# Patient Record
Sex: Male | Born: 1940 | Race: White | Hispanic: No | Smoking: Never smoker
Health system: Southern US, Community
[De-identification: ages and names within clinical notes are randomized; demographics above are authoritative.]

## PROBLEM LIST (undated history)

## (undated) DIAGNOSIS — G3185 Corticobasal degeneration: Secondary | ICD-10-CM

## (undated) HISTORY — PX: HERNIA REPAIR: SHX51

## (undated) HISTORY — DX: Corticobasal degeneration: G31.85

## (undated) HISTORY — PX: ANKLE FUSION: SHX881

---

## 2000-11-23 ENCOUNTER — Other Ambulatory Visit: Admission: RE | Admit: 2000-11-23 | Discharge: 2000-11-23 | Payer: Self-pay | Admitting: Internal Medicine

## 2011-12-27 ENCOUNTER — Ambulatory Visit: Payer: Self-pay | Admitting: General Surgery

## 2011-12-27 LAB — BASIC METABOLIC PANEL
Anion Gap: 9 (ref 7–16)
Chloride: 106 mmol/L (ref 98–107)
Co2: 30 mmol/L (ref 21–32)
Creatinine: 1.14 mg/dL (ref 0.60–1.30)
EGFR (Non-African Amer.): >60
Potassium: 4.3 mmol/L (ref 3.5–5.1)
Sodium: 145 mmol/L (ref 136–145)

## 2011-12-27 LAB — CBC
HCT: 45.3 % (ref 40.0–52.0)
HGB: 14.8 g/dL (ref 13.0–18.0)
MCH: 30.1 pg (ref 26.0–34.0)
RBC: 4.91 10*6/uL (ref 4.40–5.90)
RDW: 14.4 % (ref 11.5–14.5)

## 2011-12-29 ENCOUNTER — Ambulatory Visit: Payer: Self-pay | Admitting: General Surgery

## 2015-04-05 NOTE — Op Note (Signed)
PATIENT NAME:  Kevin Valentine, [name] MR#:  [id number] DATE OF BIRTH:  [birth year]  DATE OF PROCEDURE:  12/29/2011  PREOPERATIVE DIAGNOSIS: Umbilical hernia.   POSTOPERATIVE DIAGNOSIS: Umbilical hernia.   OPERATION: Repair of umbilical hernia.   SURGEON: Kathreen CosierS. G. Itati Brocksmith, M.D.   ANESTHESIA: General.   COMPLICATIONS: None.  ESTIMATED BLOOD LOSS: Minimal.  DRAINS: None.   DESCRIPTION OF PROCEDURE: The patient was put to sleep with LMA. The abdomen was prepped and draped out as a sterile field overlying the umbilicus. The patient had a 2 to 3 cm sized hernial protrusion with a fascial defect that appeared to be a little less than a fingerbreadth in size. Along the inferior crease of the umbilicus a skin incision was made and the umbilical skin was dissected off the hernial protrusion superiorly and the remaining dissection was performed to expose the fascial opening. The patient did have a peritoneal sac containing a small portion of the omentum and the sac was excised out flush with the fascial opening using cautery for control of bleeding. The omental tissue was easily pushed back in the abdominal cavity. Given the small size of the hernia, mesh was not utilized. The repair was achieved with two figure-of-eight stitches of 0 Prolene and the defect closed completely. After ensuring hemostasis, the deeper tissue was approximated with interrupted 2-0 Vicryl. The skin was closed with subcuticular 4-0 Vicryl reinforced with Steri-Strips and tincture of benzoin. A dry sterile dressing was placed. The patient tolerated the procedure well. At the conclusion of the closure, about 15 mL of 0.5% Marcaine was instilled in the wound for postoperative analgesia. The patient subsequently returned to the recovery room in stable condition.  ____________________________ S.Wynona LunaG. Leona Alen, MD sgs:ap D: 12/29/2011 09:33:45 ET            T: 12/29/2011 10:45:05 ET              JOB#: [id number] cc: Timoteo ExposeS.G. Evette CristalSankar, MD,  <Dictator> [facility] Wynona LunaG Neena Beecham MD ELECTRONICALLY SIGNED 01/02/2012 10:32

## 2016-07-02 ENCOUNTER — Emergency Department
Admission: EM | Admit: 2016-07-02 | Discharge: 2016-07-02 | Disposition: A | Discharge status: Home / Self Care | Payer: Medicare Other | Attending: Emergency Medicine | Admitting: Emergency Medicine

## 2016-07-02 DIAGNOSIS — W269XXA Contact with unspecified sharp object(s), initial encounter: Secondary | ICD-10-CM | POA: Insufficient documentation

## 2016-07-02 DIAGNOSIS — Z23 Encounter for immunization: Secondary | ICD-10-CM | POA: Insufficient documentation

## 2016-07-02 DIAGNOSIS — Y999 Unspecified external cause status: Secondary | ICD-10-CM | POA: Insufficient documentation

## 2016-07-02 DIAGNOSIS — Y929 Unspecified place or not applicable: Secondary | ICD-10-CM | POA: Insufficient documentation

## 2016-07-02 DIAGNOSIS — Y939 Activity, unspecified: Secondary | ICD-10-CM | POA: Diagnosis not present

## 2016-07-02 DIAGNOSIS — S61512A Laceration without foreign body of left wrist, initial encounter: Secondary | ICD-10-CM | POA: Insufficient documentation

## 2016-07-02 MED ORDER — TETANUS-DIPHTH-ACELL PERTUSSIS 5-2.5-18.5 LF-MCG/0.5 IM SUSP
0.5000 mL | Freq: Once | INTRAMUSCULAR | Status: AC
  Administered 2016-07-02: 0.5 mL via INTRAMUSCULAR
  Filled 2016-07-02: qty 0.5

## 2016-07-02 NOTE — ED Notes (Signed)
Pt was D/C by PA, unable to get vitals due to patient departure. PA reviewed D/C instructions with patient. Per PA pt stated understanding at time of D/C.

## 2016-07-02 NOTE — Discharge Instructions (Signed)
Sutured Wound Care Sutures are stitches that can be used to close wounds. Taking care of your wound properly can help to prevent pain and infection. It can also help your wound to heal more quickly. HOW TO CARE FOR YOUR SUTURED WOUND Wound Care  Keep the wound clean and dry.  If you were given a bandage (dressing), you should change it at least once per day or as directed by your health care provider. You should also change it if it becomes wet or dirty.  Keep the wound completely dry for the first 24 hours or as directed by your health care provider. After that time, you may shower or bathe. However, make sure that the wound is not soaked in water until the sutures have been removed.  Clean the wound one time each day or as directed by your health care provider.  Wash the wound with soap and water.  Rinse the wound with water to remove all soap.  Pat the wound dry with a clean towel. Do not rub the wound.  Aftercleaning the wound, apply a thin layer of antibioticointment as directed by your health care provider. This will help to prevent infection and keep the dressing from sticking to the wound.  Have the sutures removed as directed by your health care provider. General Instructions  Take or apply medicines only as directed by your health care provider.  To help prevent scarring, make sure to cover your wound with sunscreen whenever you are outside after the sutures are removed and the wound is healed. Make sure to wear a sunscreen of at least 30 SPF.  If you were prescribed an antibiotic medicine or ointment, finish all of it even if you start to feel better.  Do not scratch or pick at the wound.  Keep all follow-up visits as directed by your health care provider. This is important.  Check your wound every day for signs of infection. Watch for:   Redness, swelling, or pain.  Fluid, blood, or pus.  Raise (elevate) the injured area above the level of your heart while you  are sitting or lying down, if possible.  Avoid stretching your wound.  Drink enough fluids to keep your urine clear or pale yellow. SEEK MEDICAL CARE IF:  You received a tetanus shot and you have swelling, severe pain, redness, or bleeding at the injection site.  You have a fever.  A wound that was closed breaks open.  You notice a bad smell coming from the wound.  You notice something coming out of the wound, such as wood or glass.  Your pain is not controlled with medicine.  You have increased redness, swelling, or pain at the site of your wound.  You have fluid, blood, or pus coming from your wound.  You notice a change in the color of your skin near your wound.  You need to change the dressing frequently due to fluid, blood, or pus draining from the wound.  You develop a new rash.  You develop numbness around the wound. SEEK IMMEDIATE MEDICAL CARE IF:  You develop severe swelling around the injury site.  Your pain suddenly increases and is severe.  You develop painful lumps near the wound or on skin that is anywhere on your body.  You have a red streak going away from your wound.  The wound is on your hand or foot and you cannot properly move a finger or toe.  The wound is on your hand or foot and  you notice that your fingers or toes look pale or bluish.   This information is not intended to replace advice given to you by your health care provider. Make sure you discuss any questions you have with your health care provider.   Document Released: 01/05/2005 Document Revised: 04/14/2015 Document Reviewed: 07/10/2013 Elsevier Interactive Patient Education 2016 [employer].  Tdap Vaccine (Tetanus, Diphtheria and Pertussis): What You Need to Know 1. Why get vaccinated? Tetanus, diphtheria and pertussis are very serious diseases. Tdap vaccine can protect [place] from these diseases. And, Tdap vaccine given to pregnant women can protect newborn babies against  pertussis. TETANUS (Lockjaw) is rare in the [place] today. It causes painful muscle tightening and stiffness, usually all over the body.  It can lead to tightening of muscles in the head and neck so you can't open your mouth, swallow, or sometimes even breathe. Tetanus kills about 1 out of 10 people who are infected even after receiving the best medical care. DIPHTHERIA is also rare in the [place] today. It can cause a thick coating to form in the back of the throat.  It can lead to breathing problems, heart failure, paralysis, and death. PERTUSSIS (Whooping Cough) causes severe coughing spells, which can cause difficulty breathing, vomiting and disturbed sleep.  It can also lead to weight loss, incontinence, and rib fractures. Up to 2 in 100 adolescents and 5 in 100 adults with pertussis are hospitalized or have complications, which could include pneumonia or death. These diseases are caused by bacteria. Diphtheria and pertussis are spread from person to person through secretions from coughing or sneezing. Tetanus enters the body through cuts, scratches, or wounds. Before vaccines, as many as 200,000 cases of diphtheria, 200,000 cases of pertussis, and hundreds of cases of tetanus, were reported in the [place] each year. Since vaccination began, reports of cases for tetanus and diphtheria have dropped by about 99% and for pertussis by about 80%. 2. Tdap vaccine Tdap vaccine can protect adolescents and adults from tetanus, diphtheria, and pertussis. One dose of Tdap is routinely given at age 24 or 76. People who did not get Tdap at that age should get it as soon as possible. Tdap is especially important for healthcare professionals and anyone having close contact with a baby younger than 12 months. Pregnant women should get a dose of Tdap during every pregnancy, to protect the newborn from pertussis. Infants are most at risk for severe, life-threatening complications from  pertussis. Another vaccine, called Td, protects against tetanus and diphtheria, but not pertussis. A Td booster should be given every 10 years. Tdap may be given as one of these boosters if you have never gotten Tdap before. Tdap may also be given after a severe cut or burn to prevent tetanus infection. Your doctor or the person giving you the vaccine can give you more information. Tdap may safely be given at the same time as other vaccines. 3. Some people should not get this vaccine  A person who has ever had a life-threatening allergic reaction after a previous dose of any diphtheria, tetanus or pertussis containing vaccine, OR has a severe allergy to any part of this vaccine, should not get Tdap vaccine. Tell the person giving the vaccine about any severe allergies.  Anyone who had coma or long repeated seizures within 7 days after a childhood dose of DTP or DTaP, or a previous dose of Tdap, should not get Tdap, unless a cause other than the vaccine was found. They  can still get Td.  Talk to your doctor if you:  have seizures or another nervous system problem,  had severe pain or swelling after any vaccine containing diphtheria, tetanus or pertussis,  ever had a condition called Guillain-Barr Syndrome (GBS),  aren't feeling well on the day the shot is scheduled. 4. Risks With any medicine, including vaccines, there is a chance of side effects. These are usually mild and go away on their own. Serious reactions are also possible but are rare. Most people who get Tdap vaccine do not have any problems with it. Mild problems following Tdap (Did not interfere with activities)  Pain where the shot was given (about 3 in 4 adolescents or 2 in 3 adults)  Redness or swelling where the shot was given (about 1 person in 5)  Mild fever of at least 100.86F (up to about 1 in 25 adolescents or 1 in 100 adults)  Headache (about 3 or 4 people in 10)  Tiredness (about 1 person in 3 or 4)  Nausea,  vomiting, diarrhea, stomach ache (up to 1 in 4 adolescents or 1 in 10 adults)  Chills, sore joints (about 1 person in 10)  Body aches (about 1 person in 3 or 4)  Rash, swollen glands (uncommon) Moderate problems following Tdap (Interfered with activities, but did not require medical attention)  Pain where the shot was given (up to 1 in 5 or 6)  Redness or swelling where the shot was given (up to about 1 in 16 adolescents or 1 in 12 adults)  Fever over 102F (about 1 in 100 adolescents or 1 in 250 adults)  Headache (about 1 in 7 adolescents or 1 in 10 adults)  Nausea, vomiting, diarrhea, stomach ache (up to 1 or 3 people in 100)  Swelling of the entire arm where the shot was given (up to about 1 in 500). Severe problems following Tdap (Unable to perform usual activities; required medical attention)  Swelling, severe pain, bleeding and redness in the arm where the shot was given (rare). Problems that could happen after any vaccine:  People sometimes faint after a medical procedure, including vaccination. Sitting or lying down for about 15 minutes can help prevent fainting, and injuries caused by a fall. Tell your doctor if you feel dizzy, or have vision changes or ringing in the ears.  Some people get severe pain in the shoulder and have difficulty moving the arm where a shot was given. This happens very rarely.  Any medication can cause a severe allergic reaction. Such reactions from a vaccine are very rare, estimated at fewer than 1 in a million doses, and would happen within a few minutes to a few hours after the vaccination. As with any medicine, there is a very remote chance of a vaccine causing a serious injury or death. The safety of vaccines is always being monitored. For more information, visit: http://floyd.org/ 5. What if there is a serious problem? What should I look for?  Look for anything that concerns you, such as signs of a severe allergic reaction, very  high fever, or unusual behavior.  Signs of a severe allergic reaction can include hives, swelling of the face and throat, difficulty breathing, a fast heartbeat, dizziness, and weakness. These would usually start a few minutes to a few hours after the vaccination. What should I do?  If you think it is a severe allergic reaction or other emergency that can't wait, call 9-1-1 or get the person to the nearest hospital.  Otherwise, call your doctor.  Afterward, the reaction should be reported to the Vaccine Adverse Event Reporting System (VAERS). Your doctor might file this report, or you can do it yourself through the VAERS web site at www.vaers.LAgents.nohhs.gov, or by calling 1-865-027-3595. VAERS does not give medical advice.  6. The National Vaccine Injury Compensation Program The [employer] Vaccine Injury Compensation Program (VICP) is a federal program that was created to compensate people who may have been injured by certain vaccines. Persons who believe they may have been injured by a vaccine can learn about the program and about filing a claim by calling 1-219-443-6230 or visiting the VICP website at SpiritualWord.atwww.hrsa.gov/vaccinecompensation. There is a time limit to file a claim for compensation. 7. How can I learn more?  Ask your doctor. He or she can give you the vaccine package insert or suggest other sources of information.  Call your local or state health department.  Contact the Centers for Disease Control and Prevention (CDC):  Call 727-856-38001-919-317-1055 (1-800-CDC-INFO) or  Visit CDC's website at PicCapture.uywww.cdc.gov/vaccines CDC Tdap Vaccine VIS (02/04/14)   This information is not intended to replace advice given to you by your health care provider. Make sure you discuss any questions you have with your health care provider.   Document Released: 05/29/2012 Document Revised: 12/19/2014 Document Reviewed: 03/12/2014 Elsevier Interactive Patient Education [employer]2016 Elsevier Inc.

## 2016-07-02 NOTE — ED Provider Notes (Signed)
[facility] Emergency Department Provider Note  ____________________________________________  Time seen: Approximately 4:57 PM  I have reviewed the triage vital signs and the nursing notes.   HISTORY  Chief Complaint Laceration    HPI Kevin Valentine is a 75 y.o. male , NAD, presents to the emergency department with a several hour history of laceration to the left wrist. States he was cutting a piece of PVC pipe that turned his direction and cut his left forearm/wrist. States he applied pressure and presented to the emergency department. He is uncertain of his last tetanus vaccination and states his primary care provider note that he needed one. Denies any numbness, weakness, tingling. Has had full range of motion of the left upper extremity since the incident. Is not on any blood thinners.   No past medical history on file.  There are no active problems to display for this patient.   No past surgical history on file.  No current outpatient prescriptions on file.  Allergies Review of patient's allergies indicates no known allergies.  No family history on file.  Social History Social History  Substance Use Topics  . Smoking status: Not on file  . Smokeless tobacco: Not on file  . Alcohol Use: Not on file     Review of Systems  Constitutional: No fever/chills Cardiovascular: No chest pain. Respiratory:  No shortness of breath.  Gastrointestinal: No abdominal pain.  No nausea, vomiting.  No diarrhea.  No constipation. Genitourinary: Negative for dysuria. No hematuria. No urinary hesitancy, urgency or increased frequency. Musculoskeletal: Negative for Left arm, wrist, hand pain.  Skin: Positive laceration distal left forearm with bleeding controlled. No ecchymosis, rash, redness, swelling. Neurological: Negative for headaches, focal weakness or numbness. 10-point ROS otherwise  negative.  ____________________________________________   PHYSICAL EXAM:  VITAL SIGNS: ED Triage Vitals  Enc Vitals Group     BP 07/02/16 1621 132/78 mmHg     Pulse Rate 07/02/16 1621 79     Resp 07/02/16 1621 18     Temp 07/02/16 1621 98.4 F (36.9 C)     Temp Source 07/02/16 1621 Oral     SpO2 07/02/16 1621 97 %     Weight 07/02/16 1621 200 lb (90.719 kg)     Height 07/02/16 1621 6' (1.829 m)     Head Cir --      Peak Flow --      Pain Score 07/02/16 1621 3     Pain Loc --      Pain Edu? --      Excl. in GC? --      Constitutional: Alert and oriented. Well appearing and in no acute distress. Eyes: Conjunctivae are normal.  Head: Atraumatic. Cardiovascular: Good peripheral circulation with 2+ pulses noted in the left upper extremity. Capillary refill is brisk in all digits of the left hand. Respiratory: Normal respiratory effort without tachypnea or retractions.  Musculoskeletal: Full range of motion of the left elbow, wrist, hand, fingers and forearm without pain. No joint effusions. Neurologic:  Normal speech and language. No gross focal neurologic deficits are appreciated. Sensation to light touch grossly intact about the left upper extremity. Skin:  5 cm laceration noted to the dorsal left distal forearm with bleeding controlled. No visualization or palpation of foreign body is present. Skin is warm, dry. No rash noted. Psychiatric: Mood and affect are normal. Speech and behavior are normal. Patient exhibits appropriate insight and judgement.   ____________________________________________   LABS  None ____________________________________________  EKG  None ____________________________________________  RADIOLOGY  None ____________________________________________    PROCEDURES  Procedure(s) performed: LACERATION REPAIR Performed by: Hope Pigeon Authorized by: Hope Pigeon Consent: Verbal consent obtained. Risks and benefits: risks, benefits and  alternatives were discussed Consent given by: patient Patient identity confirmed: provided demographic data Prepped and Draped in normal sterile fashion Wound explored  Laceration Location: Left distal forearm  Laceration Length: 5cm  No Foreign Bodies seen or palpated  Anesthesia: local infiltration  Local anesthetic: lidocaine 1% with epinephrine  Anesthetic total: 5 ml  Irrigation method: syringe Amount of cleaning: standard  Skin closure: 4-0 ethilon  Number of sutures: 8  Technique: Simple, interrupted  Patient tolerance: Patient tolerated the procedure well with no immediate complications.      Medications  Tdap (BOOSTRIX) injection 0.5 mL (0.5 mLs Intramuscular Given 07/02/16 1709)     ____________________________________________   INITIAL IMPRESSION / ASSESSMENT AND PLAN / ED COURSE  Patient's diagnosis is consistent with Laceration of left wrist without complication and need for tetanus booster. Patient will be discharged home with instructions for home care to include keeping the wound clean and dry for 48 hours. Patient is to follow up with his primary care provider in 7 days for suture removal sooner if any increasing pain, redness, swelling, oozing, weeping, numbness, weakness, tingling. Patient is given ED precautions to return to the ED for any worsening or new symptoms.    ____________________________________________  FINAL CLINICAL IMPRESSION(S) / ED DIAGNOSES  Final diagnoses:  Laceration of left wrist without complication, initial encounter  Need for tetanus booster      NEW MEDICATIONS STARTED DURING THIS VISIT:  New Prescriptions   No medications on file         Hope Pigeon, PA-C 07/02/16 1817  Governor Rooks, MD 07/02/16 1930

## 2016-07-02 NOTE — ED Notes (Signed)
Pt was cutting a piece of pvc pipe that flew off and hit his left lower arm, laceration above the wrist area, controlled bleeding at this time with gauze

## 2017-01-04 ENCOUNTER — Other Ambulatory Visit: Payer: Self-pay | Admitting: Nurse Practitioner

## 2017-01-04 DIAGNOSIS — G250 Essential tremor: Secondary | ICD-10-CM

## 2017-01-13 ENCOUNTER — Ambulatory Visit
Admission: RE | Admit: 2017-01-13 | Discharge: 2017-01-13 | Disposition: A | Discharge status: Home / Self Care | Payer: Medicare Other | Source: Ambulatory Visit | Attending: Nurse Practitioner | Admitting: Nurse Practitioner

## 2017-01-13 DIAGNOSIS — G250 Essential tremor: Secondary | ICD-10-CM

## 2017-01-13 LAB — POCT I-STAT CREATININE: CREATININE: 1.2 mg/dL (ref 0.61–1.24)

## 2017-01-13 MED ORDER — GADOBENATE DIMEGLUMINE 529 MG/ML IV SOLN
20.0000 mL | Freq: Once | INTRAVENOUS | Status: AC | PRN
  Administered 2017-01-13: 20 mL via INTRAVENOUS

## 2017-01-15 DIAGNOSIS — G250 Essential tremor: Secondary | ICD-10-CM | POA: Insufficient documentation

## 2017-06-25 ENCOUNTER — Encounter: Payer: Self-pay | Admitting: Emergency Medicine

## 2017-06-25 DIAGNOSIS — K810 Acute cholecystitis: Principal | ICD-10-CM | POA: Diagnosis present

## 2017-06-25 DIAGNOSIS — R1011 Right upper quadrant pain: Secondary | ICD-10-CM | POA: Diagnosis not present

## 2017-06-25 DIAGNOSIS — N390 Urinary tract infection, site not specified: Secondary | ICD-10-CM | POA: Diagnosis present

## 2017-06-25 DIAGNOSIS — K822 Perforation of gallbladder: Secondary | ICD-10-CM | POA: Diagnosis present

## 2017-06-25 DIAGNOSIS — Z7982 Long term (current) use of aspirin: Secondary | ICD-10-CM

## 2017-06-25 LAB — URINALYSIS, COMPLETE (UACMP) WITH MICROSCOPIC
BACTERIA UA: NONE SEEN
Bilirubin Urine: NEGATIVE
Glucose, UA: NEGATIVE mg/dL
Hgb urine dipstick: NEGATIVE
Ketones, ur: NEGATIVE mg/dL
NITRITE: NEGATIVE
PROTEIN: NEGATIVE mg/dL
SPECIFIC GRAVITY, URINE: 1.018 (ref 1.005–1.030)
pH: 6 (ref 5.0–8.0)

## 2017-06-25 LAB — COMPREHENSIVE METABOLIC PANEL
ALBUMIN: 4.3 g/dL (ref 3.5–5.0)
ALT: 10 U/L — ABNORMAL LOW (ref 17–63)
AST: 23 U/L (ref 15–41)
Alkaline Phosphatase: 50 U/L (ref 38–126)
Anion gap: 10 (ref 5–15)
BILIRUBIN TOTAL: 0.5 mg/dL (ref 0.3–1.2)
BUN: 20 mg/dL (ref 6–20)
CO2: 25 mmol/L (ref 22–32)
Calcium: 9.5 mg/dL (ref 8.9–10.3)
Chloride: 105 mmol/L (ref 101–111)
Creatinine, Ser: 1.27 mg/dL — ABNORMAL HIGH (ref 0.61–1.24)
GFR calc Af Amer: >60 mL/min (ref >60)
GFR calc non Af Amer: 54 mL/min — ABNORMAL LOW (ref >60)
GLUCOSE: 106 mg/dL — AB (ref 65–99)
POTASSIUM: 4.1 mmol/L (ref 3.5–5.1)
SODIUM: 140 mmol/L (ref 135–145)
TOTAL PROTEIN: 7.6 g/dL (ref 6.5–8.1)

## 2017-06-25 LAB — CBC
HEMATOCRIT: 44.8 % (ref 40.0–52.0)
Hemoglobin: 15.2 g/dL (ref 13.0–18.0)
MCH: 30.1 pg (ref 26.0–34.0)
MCHC: 33.8 g/dL (ref 32.0–36.0)
MCV: 88.9 fL (ref 80.0–100.0)
Platelets: 191 10*3/uL (ref 150–440)
RBC: 5.04 MIL/uL (ref 4.40–5.90)
RDW: 13.9 % (ref 11.5–14.5)
WBC: 12.7 10*3/uL — ABNORMAL HIGH (ref 3.8–10.6)

## 2017-06-25 LAB — TROPONIN I

## 2017-06-25 LAB — LIPASE, BLOOD: Lipase: 31 U/L (ref 11–51)

## 2017-06-25 MED ORDER — ONDANSETRON 4 MG PO TBDP
4.0000 mg | ORAL_TABLET | Freq: Once | ORAL | Status: AC | PRN
  Administered 2017-06-25: 4 mg via ORAL
  Filled 2017-06-25: qty 1

## 2017-06-25 NOTE — ED Triage Notes (Signed)
Pt arrives POV with c/o upper abdominal pain which started around 1600 this afternoon. Pt reports being nauseated and vomiting x 1 in that time. Pt is in pain in triage but otherwise in NAD at this time.

## 2017-06-26 ENCOUNTER — Inpatient Hospital Stay
Admission: EM | Admit: 2017-06-26 | Discharge: 2017-06-27 | DRG: 417 | Disposition: A | Discharge status: Home / Self Care | Payer: Medicare Other | Attending: Surgery | Admitting: Surgery

## 2017-06-26 ENCOUNTER — Emergency Department: Payer: Medicare Other

## 2017-06-26 ENCOUNTER — Inpatient Hospital Stay: Payer: Medicare Other | Admitting: Anesthesiology

## 2017-06-26 ENCOUNTER — Encounter
Admission: EM | Disposition: A | Discharge status: Home / Self Care | Payer: Self-pay | Source: Home / Self Care | Attending: Surgery

## 2017-06-26 ENCOUNTER — Encounter: Payer: Self-pay | Admitting: *Deleted

## 2017-06-26 DIAGNOSIS — K822 Perforation of gallbladder: Secondary | ICD-10-CM | POA: Diagnosis present

## 2017-06-26 DIAGNOSIS — K810 Acute cholecystitis: Secondary | ICD-10-CM | POA: Diagnosis present

## 2017-06-26 DIAGNOSIS — N390 Urinary tract infection, site not specified: Secondary | ICD-10-CM

## 2017-06-26 DIAGNOSIS — K819 Cholecystitis, unspecified: Secondary | ICD-10-CM

## 2017-06-26 DIAGNOSIS — Z7982 Long term (current) use of aspirin: Secondary | ICD-10-CM | POA: Diagnosis not present

## 2017-06-26 DIAGNOSIS — R112 Nausea with vomiting, unspecified: Secondary | ICD-10-CM

## 2017-06-26 DIAGNOSIS — R1013 Epigastric pain: Secondary | ICD-10-CM

## 2017-06-26 DIAGNOSIS — R1011 Right upper quadrant pain: Secondary | ICD-10-CM | POA: Diagnosis present

## 2017-06-26 HISTORY — PX: CHOLECYSTECTOMY: SHX55

## 2017-06-26 LAB — SURGICAL PCR SCREEN
MRSA, PCR: NEGATIVE
Staphylococcus aureus: NEGATIVE

## 2017-06-26 SURGERY — LAPAROSCOPIC CHOLECYSTECTOMY
Anesthesia: General

## 2017-06-26 MED ORDER — ONDANSETRON HCL 4 MG/2ML IJ SOLN
4.0000 mg | Freq: Once | INTRAMUSCULAR | Status: AC
  Administered 2017-06-26: 4 mg via INTRAVENOUS
  Filled 2017-06-26: qty 2

## 2017-06-26 MED ORDER — MORPHINE SULFATE (PF) 2 MG/ML IV SOLN
2.0000 mg | Freq: Once | INTRAVENOUS | Status: AC
Start: 2017-06-26 — End: 2017-06-26
  Administered 2017-06-26 (×2): 2 mg via INTRAVENOUS
  Filled 2017-06-26: qty 1

## 2017-06-26 MED ORDER — PIPERACILLIN-TAZOBACTAM 3.375 G IVPB 30 MIN
3.3750 g | Freq: Once | INTRAVENOUS | Status: AC
  Administered 2017-06-26: 3.375 g via INTRAVENOUS

## 2017-06-26 MED ORDER — HYDROMORPHONE HCL 1 MG/ML IJ SOLN
0.5000 mg | INTRAMUSCULAR | Status: DC | PRN
  Administered 2017-06-26: 0.5 mg via INTRAVENOUS
  Filled 2017-06-26: qty 0.5

## 2017-06-26 MED ORDER — PROPOFOL 10 MG/ML IV BOLUS
INTRAVENOUS | Status: DC | PRN
  Administered 2017-06-26: 150 mg via INTRAVENOUS

## 2017-06-26 MED ORDER — DEXAMETHASONE SODIUM PHOSPHATE 10 MG/ML IJ SOLN
INTRAMUSCULAR | Status: AC
  Filled 2017-06-26: qty 1

## 2017-06-26 MED ORDER — ONDANSETRON HCL 4 MG/2ML IJ SOLN
INTRAMUSCULAR | Status: DC | PRN
  Administered 2017-06-26: 4 mg via INTRAVENOUS

## 2017-06-26 MED ORDER — ONDANSETRON HCL 4 MG/2ML IJ SOLN: 4.0000 mg | Freq: Once | INTRAMUSCULAR | Status: DC | PRN

## 2017-06-26 MED ORDER — ROCURONIUM BROMIDE 100 MG/10ML IV SOLN
INTRAVENOUS | Status: DC | PRN
  Administered 2017-06-26: 30 mg via INTRAVENOUS
  Administered 2017-06-26: 20 mg via INTRAVENOUS
  Administered 2017-06-26: 10 mg via INTRAVENOUS

## 2017-06-26 MED ORDER — ONDANSETRON HCL 4 MG PO TABS: 4.0000 mg | ORAL_TABLET | Freq: Four times a day (QID) | ORAL | Status: DC | PRN

## 2017-06-26 MED ORDER — KETOROLAC TROMETHAMINE 30 MG/ML IJ SOLN
INTRAMUSCULAR | Status: AC
  Filled 2017-06-26: qty 1

## 2017-06-26 MED ORDER — FENTANYL CITRATE (PF) 100 MCG/2ML IJ SOLN
INTRAMUSCULAR | Status: DC | PRN
  Administered 2017-06-26 (×2): 50 ug via INTRAVENOUS
  Administered 2017-06-26: 100 ug via INTRAVENOUS

## 2017-06-26 MED ORDER — PROMETHAZINE HCL 25 MG RE SUPP
25.0000 mg | Freq: Once | RECTAL | Status: AC
  Administered 2017-06-26: 25 mg via RECTAL
  Filled 2017-06-26: qty 1

## 2017-06-26 MED ORDER — MORPHINE SULFATE (PF) 2 MG/ML IV SOLN
INTRAVENOUS | Status: AC
  Administered 2017-06-26: 2 mg via INTRAVENOUS
  Filled 2017-06-26: qty 1

## 2017-06-26 MED ORDER — MORPHINE SULFATE (PF) 2 MG/ML IV SOLN
2.0000 mg | Freq: Once | INTRAVENOUS | Status: AC
  Administered 2017-06-26: 2 mg via INTRAVENOUS

## 2017-06-26 MED ORDER — HYDROCODONE-ACETAMINOPHEN 5-325 MG PO TABS: 1.0000 | ORAL_TABLET | ORAL | Status: DC | PRN

## 2017-06-26 MED ORDER — DEXAMETHASONE SODIUM PHOSPHATE 10 MG/ML IJ SOLN
INTRAMUSCULAR | Status: DC | PRN
  Administered 2017-06-26: 10 mg via INTRAVENOUS

## 2017-06-26 MED ORDER — ONDANSETRON HCL 4 MG/2ML IJ SOLN
4.0000 mg | Freq: Four times a day (QID) | INTRAMUSCULAR | Status: DC | PRN
  Filled 2017-06-26: qty 2

## 2017-06-26 MED ORDER — BUPIVACAINE HCL (PF) 0.5 % IJ SOLN
INTRAMUSCULAR | Status: AC
  Filled 2017-06-26: qty 30

## 2017-06-26 MED ORDER — FENTANYL CITRATE (PF) 100 MCG/2ML IJ SOLN
INTRAMUSCULAR | Status: AC
  Filled 2017-06-26: qty 2

## 2017-06-26 MED ORDER — DEXTROSE IN LACTATED RINGERS 5 % IV SOLN
INTRAVENOUS | Status: DC
  Administered 2017-06-26 – 2017-06-27 (×3): via INTRAVENOUS

## 2017-06-26 MED ORDER — SUGAMMADEX SODIUM 500 MG/5ML IV SOLN
INTRAVENOUS | Status: AC
  Filled 2017-06-26: qty 5

## 2017-06-26 MED ORDER — LIDOCAINE HCL (PF) 1 % IJ SOLN
INTRAMUSCULAR | Status: AC
  Filled 2017-06-26: qty 30

## 2017-06-26 MED ORDER — SODIUM CHLORIDE 0.9 % IV BOLUS (SEPSIS)
1000.0000 mL | Freq: Once | INTRAVENOUS | Status: AC
  Administered 2017-06-26: 1000 mL via INTRAVENOUS

## 2017-06-26 MED ORDER — KETOROLAC TROMETHAMINE 30 MG/ML IJ SOLN
INTRAMUSCULAR | Status: DC | PRN
  Administered 2017-06-26: 30 mg via INTRAVENOUS

## 2017-06-26 MED ORDER — LACTATED RINGERS IV SOLN
INTRAVENOUS | Status: DC | PRN
  Administered 2017-06-26: 11:00:00 via INTRAVENOUS

## 2017-06-26 MED ORDER — LIDOCAINE HCL 1 % IJ SOLN
INTRAMUSCULAR | Status: DC | PRN
  Administered 2017-06-26: 26 mL

## 2017-06-26 MED ORDER — HYDROMORPHONE HCL 1 MG/ML IJ SOLN
0.5000 mg | Freq: Once | INTRAMUSCULAR | Status: AC
  Administered 2017-06-26: 0.5 mg via INTRAVENOUS
  Filled 2017-06-26: qty 1

## 2017-06-26 MED ORDER — HEPARIN SODIUM (PORCINE) 5000 UNIT/ML IJ SOLN
5000.0000 [IU] | Freq: Three times a day (TID) | INTRAMUSCULAR | Status: DC
  Administered 2017-06-26 – 2017-06-27 (×3): 5000 [IU] via SUBCUTANEOUS
  Filled 2017-06-26 (×3): qty 1

## 2017-06-26 MED ORDER — SUGAMMADEX SODIUM 500 MG/5ML IV SOLN
INTRAVENOUS | Status: DC | PRN
  Administered 2017-06-26: 200 mg via INTRAVENOUS

## 2017-06-26 MED ORDER — SODIUM CHLORIDE 0.9 % IV SOLN
3.0000 g | Freq: Four times a day (QID) | INTRAVENOUS | Status: DC
  Administered 2017-06-26 – 2017-06-27 (×5): 3 g via INTRAVENOUS
  Filled 2017-06-26 (×6): qty 3

## 2017-06-26 MED ORDER — ROCURONIUM BROMIDE 50 MG/5ML IV SOLN
INTRAVENOUS | Status: AC
  Filled 2017-06-26: qty 1

## 2017-06-26 MED ORDER — ONDANSETRON HCL 4 MG/2ML IJ SOLN
INTRAMUSCULAR | Status: AC
  Filled 2017-06-26: qty 2

## 2017-06-26 MED ORDER — FENTANYL CITRATE (PF) 100 MCG/2ML IJ SOLN: 25.0000 ug | INTRAMUSCULAR | Status: DC | PRN

## 2017-06-26 SURGICAL SUPPLY — 61 items
ADH LQ OCL WTPRF AMP STRL LF (MISCELLANEOUS) ×1
ADHESIVE MASTISOL STRL (MISCELLANEOUS) ×3 IMPLANT
APPLIER CLIP ROT 10 11.4 M/L (STAPLE) ×3
APR CLP MED LRG 11.4X10 (STAPLE) ×1
BAG COUNTER SPONGE EZ (MISCELLANEOUS) ×1 IMPLANT
BAG SPNG 4X4 CLR HAZ (MISCELLANEOUS)
BLADE SURG SZ11 CARB STEEL (BLADE) ×3 IMPLANT
BULB RESERV EVAC DRAIN JP 100C (MISCELLANEOUS) ×2 IMPLANT
CANISTER SUCT 1200ML W/VALVE (MISCELLANEOUS) ×3 IMPLANT
CANISTER SUCT 3000ML (MISCELLANEOUS) ×2 IMPLANT
CATH CHOLANG 76X19 KUMAR (CATHETERS) ×1 IMPLANT
CHLORAPREP W/TINT 26ML (MISCELLANEOUS) ×3 IMPLANT
CLIP APPLIE ROT 10 11.4 M/L (STAPLE) ×1 IMPLANT
CLOSURE WOUND 1/2 X4 (GAUZE/BANDAGES/DRESSINGS)
CONRAY 60ML FOR OR (MISCELLANEOUS) IMPLANT
COUNTER SPONGE BAG EZ (MISCELLANEOUS)
DECANTER SPIKE VIAL GLASS SM (MISCELLANEOUS) ×6 IMPLANT
DRAIN CHANNEL JP 15F RND 16 (MISCELLANEOUS) ×2 IMPLANT
DRAPE SHEET LG 3/4 BI-LAMINATE (DRAPES) ×1 IMPLANT
DRSG TEGADERM 2-3/8X2-3/4 SM (GAUZE/BANDAGES/DRESSINGS) ×12 IMPLANT
DRSG TELFA 4X3 1S NADH ST (GAUZE/BANDAGES/DRESSINGS) ×5 IMPLANT
ELECT REM PT RETURN 9FT ADLT (ELECTROSURGICAL) ×3
ELECTRODE REM PT RTRN 9FT ADLT (ELECTROSURGICAL) ×1 IMPLANT
GAUZE SPONGE 4X4 12PLY STRL (GAUZE/BANDAGES/DRESSINGS) ×2 IMPLANT
GLOVE BIO SURGEON STRL SZ 6.5 (GLOVE) ×1 IMPLANT
GLOVE BIO SURGEON STRL SZ7.5 (GLOVE) ×9 IMPLANT
GLOVE BIO SURGEONS STRL SZ 6.5 (GLOVE) ×1
GLOVE BIOGEL PI IND STRL 7.0 (GLOVE) IMPLANT
GLOVE BIOGEL PI INDICATOR 7.0 (GLOVE) ×6
GLOVE INDICATOR 8.0 STRL GRN (GLOVE) ×3 IMPLANT
GLOVE SKINSENSE NS SZ6.5 (GLOVE) ×2
GLOVE SKINSENSE STRL SZ6.5 (GLOVE) IMPLANT
GOWN STRL REUS W/ TWL LRG LVL3 (GOWN DISPOSABLE) ×3 IMPLANT
GOWN STRL REUS W/TWL LRG LVL3 (GOWN DISPOSABLE) ×12
GRASPER SUT TROCAR 14GX15 (MISCELLANEOUS) IMPLANT
IRRIGATION STRYKERFLOW (MISCELLANEOUS) IMPLANT
IRRIGATOR STRYKERFLOW (MISCELLANEOUS) ×3
IV NS 1000ML (IV SOLUTION) ×3
IV NS 1000ML BAXH (IV SOLUTION) IMPLANT
L-HOOK LAP DISP 36CM (ELECTROSURGICAL) ×3
LABEL OR SOLS (LABEL) ×3 IMPLANT
LHOOK LAP DISP 36CM (ELECTROSURGICAL) ×1 IMPLANT
NDL HYPO 25X1 1.5 SAFETY (NEEDLE) ×1 IMPLANT
NEEDLE HYPO 25X1 1.5 SAFETY (NEEDLE) ×3 IMPLANT
NEEDLE VERESS 14GA 120MM (NEEDLE) ×3 IMPLANT
NS IRRIG 500ML POUR BTL (IV SOLUTION) ×3 IMPLANT
PACK LAP CHOLECYSTECTOMY (MISCELLANEOUS) ×3 IMPLANT
PENCIL ELECTRO HAND CTR (MISCELLANEOUS) ×3 IMPLANT
POUCH ENDO CATCH 10MM SPEC (MISCELLANEOUS) ×3 IMPLANT
SCISSORS METZENBAUM CVD 33 (INSTRUMENTS) ×3 IMPLANT
SLEEVE ENDOPATH XCEL 5M (ENDOMECHANICALS) ×6 IMPLANT
STRIP CLOSURE SKIN 1/2X4 (GAUZE/BANDAGES/DRESSINGS) IMPLANT
SUT ETHILON 3-0 FS-10 30 BLK (SUTURE) ×3
SUT MNCRL 4-0 (SUTURE) ×6
SUT MNCRL 4-0 27XMFL (SUTURE) ×2
SUT VICRYL 0 AB UR-6 (SUTURE) ×2 IMPLANT
SUTURE EHLN 3-0 FS-10 30 BLK (SUTURE) IMPLANT
SUTURE MNCRL 4-0 27XMF (SUTURE) ×1 IMPLANT
TROCAR XCEL 12X100 BLDLESS (ENDOMECHANICALS) ×3 IMPLANT
TROCAR XCEL NON-BLD 5MMX100MML (ENDOMECHANICALS) ×3 IMPLANT
TUBING INSUFFLATOR HI FLOW (MISCELLANEOUS) ×3 IMPLANT

## 2017-06-26 NOTE — Anesthesia Post-op Follow-up Note (Cosign Needed)
Anesthesia QCDR form completed.        

## 2017-06-26 NOTE — ED Provider Notes (Signed)
[facility] Emergency Department Provider Note   ____________________________________________   First MD Initiated Contact with Patient 06/26/17 [phone]     (approximate)  I have reviewed the triage vital signs and the nursing notes.   HISTORY  Chief Complaint Abdominal Pain    HPI Kevin Valentine is a 76 y.o. male who presents to the ED from home with a chief complaint of abdominal pain and vomiting. Patient reports onset of upper abdominal pain approximately 4 PM associated with nausea and vomiting 1 during that episode. He was able to eat subsequently which did not affect his pain. Presents secondary to persistent aching type pain radiating to his back. Denies recent fever, chills, chest pain, shortness of breath, dysuria, diarrhea, constipation. Denies recent travel or trauma. Nothing makes his symptoms better or worse.   Past medical history None  Patient Active Problem List   Diagnosis Date Noted  . Acute cholecystitis 06/26/2017    Past Surgical History:  Procedure Laterality Date  . ANKLE FUSION Right   . HERNIA REPAIR      Prior to Admission medications   Medication Sig Start Date End Date Taking? Authorizing Provider  aspirin EC 81 MG tablet Take 81 mg by mouth daily.   Yes [provider]    Allergies Patient has no known allergies.  No family history on file.  Social History Social History  Substance Use Topics  . Smoking status: Never Smoker  . Smokeless tobacco: Never Used  . Alcohol use No    Review of Systems  Constitutional: No fever/chills. Eyes: No visual changes. ENT: No sore throat. Cardiovascular: Denies chest pain. Respiratory: Denies shortness of breath. Gastrointestinal: Positive for abdominal pain, nausea and vomiting.  No diarrhea.  No constipation. Genitourinary: Negative for dysuria. Musculoskeletal: Negative for back pain. Skin: Negative for rash. Neurological: Negative for headaches,  focal weakness or numbness.   ____________________________________________   PHYSICAL EXAM:  VITAL SIGNS: ED Triage Vitals  Enc Vitals Group     BP 06/25/17 2238 (!) 180/77     Pulse Rate 06/25/17 2238 (!) 56     Resp 06/25/17 2238 (!) 22     Temp 06/25/17 2238 98.6 F (37 C)     Temp Source 06/25/17 2238 Oral     SpO2 06/25/17 2238 100 %     Weight 06/25/17 2240 210 lb (95.3 kg)     Height 06/25/17 2240 6' (1.829 m)     Head Circumference --      Peak Flow --      Pain Score 06/25/17 2237 8     Pain Loc --      Pain Edu? --      Excl. in GC? --     Constitutional: Alert and oriented. Uncomfortable appearing and in no acute distress. Eyes: Conjunctivae are normal. PERRL. EOMI. Head: Atraumatic. Nose: No congestion/rhinnorhea. Mouth/Throat: Mucous membranes are moist.  Oropharynx non-erythematous. Neck: No stridor.   Cardiovascular: Normal rate, regular rhythm. Grossly normal heart sounds.  Good peripheral circulation. Respiratory: Normal respiratory effort.  No retractions. Lungs CTAB. Gastrointestinal: Soft and mildly tender to palpation epigastrium and right upper quadrant without rebound or guarding. No distention. No abdominal bruits. No CVA tenderness. Musculoskeletal: No lower extremity tenderness nor edema.  No joint effusions. Neurologic:  Normal speech and language. No gross focal neurologic deficits are appreciated. No gait instability. Skin:  Skin is warm, dry and intact. No rash noted. Psychiatric: Mood and affect are normal. Speech and behavior  are normal.  ____________________________________________   LABS (all labs ordered are listed, but only abnormal results are displayed)  Labs Reviewed  COMPREHENSIVE METABOLIC PANEL - Abnormal; Notable for the following:       Result Value   Glucose, Bld 106 (*)    Creatinine, Ser 1.27 (*)    ALT 10 (*)    GFR calc non Af Amer 54 (*)    All other components within normal limits  CBC - Abnormal; Notable for  the following:    WBC 12.7 (*)    All other components within normal limits  URINALYSIS, COMPLETE (UACMP) WITH MICROSCOPIC - Abnormal; Notable for the following:    Color, Urine YELLOW (*)    APPearance CLEAR (*)    Leukocytes, UA MODERATE (*)    Squamous Epithelial / LPF 0-5 (*)    All other components within normal limits  LIPASE, BLOOD  TROPONIN I  CBC  CREATININE, SERUM   ____________________________________________  EKG  ED ECG REPORT I, Sundiata Ferrick J, the attending physician, personally viewed and interpreted this ECG.   Date: 06/26/2017  EKG Time: 2237  Rate: 56  Rhythm: sinus bradycardia  Axis: Normal  Intervals:none  ST&T Change: Nonspecific  ____________________________________________  RADIOLOGY  US Abdomen Limited Ruq  Result Date: 06/26/2017 CLINICAL DATA:  Upper abdominal pain, nausea and vomiting EXAM: ULTRASOUND ABDOMEN LIMITED RIGHT UPPER QUADRANT COMPARISON:  None. FINDINGS: Gallbladder: Biliary sludge with mild gallbladder wall thickening up to 5 mm. Tiny nonshadowing gallstones are not entirely excluded. Patient was medicated and therefore assessment for sonographic Murphy's sign was not reliable. Common bile duct: Diameter: Normal at 3.4 mm Liver: No focal lesion identified. Within normal limits in parenchymal echogenicity. IMPRESSION: Gallbladder distention with wall thickening and biliary sludge. Tiny nonshadowing stones are not entirely excluded. Findings are suspicious for acute cholecystitis. Electronically Signed   By: Tollie Eth M.D.   On: 06/26/2017 03:05    ____________________________________________   PROCEDURES  Procedure(s) performed: None  Procedures  Critical Care performed: No  ____________________________________________   INITIAL IMPRESSION / ASSESSMENT AND PLAN / ED COURSE  Pertinent labs & imaging results that were available during my care of the patient were reviewed by me and considered in my medical decision making (see  chart for details).  76 year old male without significant past medical history who presents with upper abdominal pain and vomiting. Laboratory and urine analysis results remarkable for mild leukocytosis, renal insufficiency, moderate leukocytes. Review of patient's chart demonstrates screening aortic aneurysm ultrasound in 2015 which was within normal limits. Clinically symptoms suggest cholelithiasis/cholecystitis. Will initiate IV fluid resuscitation, analgesia, antibiotics and proceed to right upper quadrant limited ultrasound.  Clinical Course as of Jun 26 416  Mon Jun 26, 2017  1610 Updated patient and son of ultrasound results. Discussed with surgeon on call Dr. Excell Seltzer who will evaluate patient in the emergency department.  [JS]    Clinical Course User Index [JS] Irean Hong, MD     ____________________________________________   FINAL CLINICAL IMPRESSION(S) / ED DIAGNOSES  Final diagnoses:  Epigastric pain  Non-intractable vomiting with nausea, unspecified vomiting type  Lower urinary tract infectious disease  Cholecystitis      NEW MEDICATIONS STARTED DURING THIS VISIT:  New Prescriptions   No medications on file     Note:  This document was prepared using Dragon voice recognition software and may include unintentional dictation errors.    Irean Hong, MD 06/26/17 [phone]

## 2017-06-26 NOTE — ED Notes (Signed)
Pt transport to 206 

## 2017-06-26 NOTE — ED Notes (Signed)
Patient transported to Ultrasound 

## 2017-06-26 NOTE — H&P (Signed)
Kevin Valentine is an 76 y.o. male.    Chief Complaint: Back pain and right upper quadrant pain  HPI: This a patient with the first and only episode of right upper quadrant and back pain that started approximately 1600 on 06/25/2017. It is better now than it was a rates today 2. He's had nausea and multiple emesis but no fevers or chills no jaundice or acholic stools. He's never had an episode like this before. He denies any abdominal surgery.  History reviewed. No pertinent past medical history. Patient does not take any medications and denies any medical problems.  Past Surgical History:  Procedure Laterality Date  . ANKLE FUSION Right   . HERNIA REPAIR      No family history on file. Patient's mother had gallbladder disease Social History:  reports that he has never smoked. He has never used smokeless tobacco. He reports that he does not drink alcohol or use drugs. He is retired from [employer]  Allergies: No Known Allergies   (Not in a hospital admission)   Review of Systems  Constitutional: Negative for chills and fever.  HENT: Negative.   Eyes: Negative.   Respiratory: Negative.   Cardiovascular: Negative.   Gastrointestinal: Positive for abdominal pain, nausea and vomiting. Negative for blood in stool, constipation, diarrhea and heartburn.  Genitourinary: Negative.   Musculoskeletal: Negative.   Skin: Negative.   Neurological: Negative.   Endo/Heme/Allergies: Negative.   Psychiatric/Behavioral: Negative.      Physical Exam:  BP (!) 155/87   Pulse (!) 58   Temp 98.6 F (37 C) (Oral)   Resp 20   Ht 6' (1.829 m)   Wt 210 lb (95.3 kg)   SpO2 98%   BMI 28.48 kg/m   Physical Exam  Constitutional: He is oriented to person, place, and time and well-developed, well-nourished, and in no distress. No distress.  HENT:  Head: Normocephalic and atraumatic.  Eyes: Pupils are equal, round, and reactive to light. Right eye exhibits no discharge. Left  eye exhibits no discharge. No scleral icterus.  Neck: Normal range of motion.  Cardiovascular: Normal rate and regular rhythm.   Pulmonary/Chest: Effort normal. No respiratory distress.  Abdominal: Soft. He exhibits no distension. There is no tenderness. There is no rebound and no guarding.  Minimal tenderness in the right upper quadrant with a negative Murphy sign no guarding or rebound.  Musculoskeletal: Normal range of motion. He exhibits no edema.  Lymphadenopathy:    He has no cervical adenopathy.  Neurological: He is alert and oriented to person, place, and time.  Skin: Skin is warm and dry. No rash noted. He is not diaphoretic. No erythema.  Psychiatric: Mood and affect normal.  Vitals reviewed.       Results for orders placed or performed during the hospital encounter of 06/26/17 (from the past 48 hour(s))  Lipase, blood     Status: None   Collection Time: 06/25/17 10:38 PM  Result Value Ref Range   Lipase 31 11 - 51 U/L  Comprehensive metabolic panel     Status: Abnormal   Collection Time: 06/25/17 10:38 PM  Result Value Ref Range   Sodium 140 135 - 145 mmol/L   Potassium 4.1 3.5 - 5.1 mmol/L   Chloride 105 101 - 111 mmol/L   CO2 25 22 - 32 mmol/L   Glucose, Bld 106 (H) 65 - 99 mg/dL   BUN 20 6 - 20 mg/dL   Creatinine, Ser 5.78 (H) 0.61 - 1.24  mg/dL   Calcium 9.5 8.9 - 10.3 mg/dL   Total Protein 7.6 6.5 - 8.1 g/dL   Albumin 4.3 3.5 - 5.0 g/dL   AST 23 15 - 41 U/L   ALT 10 (L) 17 - 63 U/L   Alkaline Phosphatase 50 38 - 126 U/L   Total Bilirubin 0.5 0.3 - 1.2 mg/dL   GFR calc non Af Amer 54 (L) >60 mL/min   GFR calc Af Amer >60 >60 mL/min    Comment: (NOTE) The eGFR has been calculated using the CKD EPI equation. This calculation has not been validated in all clinical situations. eGFR's persistently <60 mL/min signify possible Chronic Kidney Disease.    Anion gap 10 5 - 15  CBC     Status: Abnormal   Collection Time: 06/25/17 10:38 PM  Result Value Ref Range    WBC 12.7 (H) 3.8 - 10.6 K/uL   RBC 5.04 4.40 - 5.90 MIL/uL   Hemoglobin 15.2 13.0 - 18.0 g/dL   HCT 44.8 40.0 - 52.0 %   MCV 88.9 80.0 - 100.0 fL   MCH 30.1 26.0 - 34.0 pg   MCHC 33.8 32.0 - 36.0 g/dL   RDW 13.9 11.5 - 14.5 %   Platelets 191 150 - 440 K/uL  Urinalysis, Complete w Microscopic     Status: Abnormal   Collection Time: 06/25/17 10:38 PM  Result Value Ref Range   Color, Urine YELLOW (A) YELLOW   APPearance CLEAR (A) CLEAR   Specific Gravity, Urine 1.018 1.005 - 1.030   pH 6.0 5.0 - 8.0   Glucose, UA NEGATIVE NEGATIVE mg/dL   Hgb urine dipstick NEGATIVE NEGATIVE   Bilirubin Urine NEGATIVE NEGATIVE   Ketones, ur NEGATIVE NEGATIVE mg/dL   Protein, ur NEGATIVE NEGATIVE mg/dL   Nitrite NEGATIVE NEGATIVE   Leukocytes, UA MODERATE (A) NEGATIVE   RBC / HPF 0-5 0 - 5 RBC/hpf   WBC, UA 0-5 0 - 5 WBC/hpf   Bacteria, UA NONE SEEN NONE SEEN   Squamous Epithelial / LPF 0-5 (A) NONE SEEN   Mucous PRESENT   Troponin I     Status: None   Collection Time: 06/25/17 10:38 PM  Result Value Ref Range   Troponin I <0.03 <0.03 ng/mL   US Abdomen Limited Ruq  Result Date: 06/26/2017 CLINICAL DATA:  Upper abdominal pain, nausea and vomiting EXAM: ULTRASOUND ABDOMEN LIMITED RIGHT UPPER QUADRANT COMPARISON:  None. FINDINGS: Gallbladder: Biliary sludge with mild gallbladder wall thickening up to 5 mm. Tiny nonshadowing gallstones are not entirely excluded. Patient was medicated and therefore assessment for sonographic Murphy's sign was not reliable. Common bile duct: Diameter: Normal at 3.4 mm Liver: No focal lesion identified. Within normal limits in parenchymal echogenicity. IMPRESSION: Gallbladder distention with wall thickening and biliary sludge. Tiny nonshadowing stones are not entirely excluded. Findings are suspicious for acute cholecystitis. Electronically Signed   By: Ashley Royalty M.D.   On: 06/26/2017 03:05     Assessment/Plan  Ultrasound is reviewed showing probable acute  cholecystitis with sludge and small gallstones. LFTs are within normal limits. Creatinine is slightly elevated 1.27. With the patient's diagnosis of acute cholecystitis he has been given the option of coming in the hospital for surgery or being seen in the office as his pain has improved this morning. He states that his pain is still present and he wants to come in the hospital and consider surgical intervention.  I discussed with him the surgery itself in detail I described for him the  potential for mesh placement and his prior repaired umbilical hernia complicating entry into the abdominal cavity. We discussed risks of bowel injury and conversion to an open procedure. I described for him the laparoscopic approach and the risk of bleeding infection bowel injury bile duct injury or bile duct leak retained bile duct stone any of which could require further surgery. I also mentioned that Dr. Adonis Huguenin would be performing the surgery today or tomorrow depending on surgical scheduling. He and his son understood and agreed to proceed questions were answered for them.  Florene Glen, MD, FACS

## 2017-06-26 NOTE — Anesthesia Preprocedure Evaluation (Signed)
Anesthesia Evaluation  Patient identified by MRN, date of birth, ID band Patient awake    Reviewed: Allergy & Precautions, H&P , NPO status , Patient's Chart, lab work & pertinent test results, reviewed documented beta blocker date and time   Airway Mallampati: II  TM Distance: >3 FB Neck ROM: full    Dental  (+) Teeth Intact   Pulmonary neg pulmonary ROS,    Pulmonary exam normal        Cardiovascular negative cardio ROS Normal cardiovascular exam Rhythm:regular Rate:Normal     Neuro/Psych negative neurological ROS  negative psych ROS   GI/Hepatic negative GI ROS, Neg liver ROS,   Endo/Other  negative endocrine ROS  Renal/GU negative Renal ROS  negative genitourinary   Musculoskeletal   Abdominal   Peds  Hematology negative hematology ROS (+)   Anesthesia Other Findings History reviewed. No pertinent past medical history. Past Surgical History: No date: ANKLE FUSION; Right No date: HERNIA REPAIR BMI    Body Mass Index:  30.61 kg/m     Reproductive/Obstetrics negative OB ROS                             Anesthesia Physical Anesthesia Plan  ASA: II  Anesthesia Plan: General ETT   Post-op Pain Management:    Induction:   PONV Risk Score and Plan: 3 and Ondansetron, Dexamethasone, Propofol and Midazolam  Airway Management Planned:   Additional Equipment:   Intra-op Plan:   Post-operative Plan:   Informed Consent: I have reviewed the patients History and Physical, chart, labs and discussed the procedure including the risks, benefits and alternatives for the proposed anesthesia with the patient or authorized representative who has indicated his/her understanding and acceptance.   Dental Advisory Given  Plan Discussed with: CRNA  Anesthesia Plan Comments:         Anesthesia Quick Evaluation

## 2017-06-26 NOTE — ED Notes (Signed)
Pt states pain is in upper bilateral quadrants and radiates to mid back. Pt states onset suddenly accompanied by emesis x4. Pt denies diarrhea, fever. Pt states he has had chills, no pulsitile mass noted to abd. Skin normal color warm and dry. resps unlabored.

## 2017-06-26 NOTE — Anesthesia Procedure Notes (Signed)
Procedure Name: Intubation Date/Time: 06/26/2017 11:23 AM Performed by: Omer JackWEATHERLY, Melessa Cowell Pre-anesthesia Checklist: Patient identified, Patient being monitored, Timeout performed, Emergency Drugs available and Suction available Patient Re-evaluated:Patient Re-evaluated prior to induction Oxygen Delivery Method: Circle system utilized Preoxygenation: Pre-oxygenation with 100% oxygen Induction Type: IV induction Ventilation: Mask ventilation without difficulty Laryngoscope Size: Miller and 2 Grade View: Grade I Tube type: Oral Tube size: 7.0 mm Number of attempts: 1 Placement Confirmation: ETT inserted through vocal cords under direct vision,  positive ETCO2 and breath sounds checked- equal and bilateral Secured at: 21 cm Tube secured with: Tape Dental Injury: Teeth and Oropharynx as per pre-operative assessment

## 2017-06-26 NOTE — Progress Notes (Signed)
  Patient seen and examined Agree with Dr. Excell Seltzerooper.  Plan to head to OR as soon as possible for Laparoscopic Cholecystectomy All questions answered to patient's satisfaction.  Ricarda Frameharles Roselia Snipe, MD [facility] DetroitFACS General Surgeon [facility]  Day [facility] [phone]) [phone] Night [facility] [phone](7p-7a) [phone]

## 2017-06-26 NOTE — ED Notes (Signed)
Pt reports no improvement in pain after morphine administration. md notified, order for additional pain medication received.

## 2017-06-26 NOTE — Transfer of Care (Signed)
Immediate Anesthesia Transfer of Care Note  Patient: Kevin Valentine  Procedure(s) Performed: Procedure(s): LAPAROSCOPIC CHOLECYSTECTOMY (N/A)  Patient Location: PACU  Anesthesia Type:General  Level of Consciousness: sedated and responds to stimulation  Airway & Oxygen Therapy: Patient Spontanous Breathing and Patient connected to face mask oxygen  Post-op Assessment: Report given to RN and Post -op Vital signs reviewed and stable  Post vital signs: Reviewed and stable  Last Vitals:  Vitals:   06/26/17 1027 06/26/17 1308  BP: (!) 144/74 (!) 90/55  Pulse: 66 68  Resp: 18 (!) 22  Temp: 37.2 C 36.6 C    Last Pain:  Vitals:   06/26/17 1308  TempSrc:   PainSc: Asleep      Patients Stated Pain Goal: 0 (06/26/17 0616)  Complications: No apparent anesthesia complications

## 2017-06-26 NOTE — Brief Op Note (Signed)
06/26/2017  12:59 PM  PATIENT:  Sherie DonFranklin D Ceasar  76 y.o. male  PRE-OPERATIVE DIAGNOSIS:  acute cholecystitis  POST-OPERATIVE DIAGNOSIS:  acute cholecystitis  PROCEDURE:  Procedure(s): LAPAROSCOPIC CHOLECYSTECTOMY (N/A)  SURGEON:  Surgeon(s) and Role:    * Ricarda FrameWoodham, Favor Kreh, MD - Primary  PHYSICIAN ASSISTANT:   ASSISTANTS: PA student   ANESTHESIA:   general  EBL:  No intake/output data recorded.  BLOOD ADMINISTERED:none  DRAINS: 51(19 [place]French) Blake drain(s) in the Perihepatic space   LOCAL MEDICATIONS USED:  MARCAINE    and XYLOCAINE   SPECIMEN:  Source of Specimen:  Gallbladder  DISPOSITION OF SPECIMEN:  PATHOLOGY  COUNTS:  YES  TOURNIQUET:  * No tourniquets in log *  DICTATION: .Dragon Dictation  PLAN OF CARE: Return to inpatient status  PATIENT DISPOSITION:  PACU - hemodynamically stable.   Delay start of Pharmacological VTE agent (>24hrs) due to surgical blood loss or risk of bleeding: no

## 2017-06-26 NOTE — Op Note (Signed)
Laparoscopic Cholecystectomy  Pre-operative Diagnosis: Acute cholecystitis  Post-operative Diagnosis: Gangrenous cholecystitis  Procedure: Laparoscopic cholecystectomy  Surgeon: Leonette Most T. Tonita Cong, MD FACS  Anesthesia: Gen. with endotracheal tube  Assistant: PA student  Procedure Details  The patient was seen again in the Holding Room. The benefits, complications, treatment options, and expected outcomes were discussed with the patient. The risks of bleeding, infection, recurrence of symptoms, failure to resolve symptoms, bile duct damage, bile duct leak, retained common bile duct stone, bowel injury, any of which could require further surgery and/or ERCP, stent, or papillotomy were reviewed with the patient. The likelihood of improving the patient's symptoms with return to their baseline status is good.  The patient and/or family concurred with the proposed plan, giving informed consent.  The patient was taken to Operating Room, identified as Kevin Valentine and the procedure verified as Laparoscopic Cholecystectomy.  A Time Out was held and the above information confirmed.  Prior to the induction of general anesthesia, antibiotic prophylaxis was administered. VTE prophylaxis was in place. General endotracheal anesthesia was then administered and tolerated well. After the induction, the abdomen was prepped with Chloraprep and draped in the sterile fashion. The patient was positioned in the supine position.  Local anesthetic was injected into the skin 2 fingerbreadths below the costal margin in the right midclavicular line and an incision made. The Veress needle was placed. Pneumoperitoneum was then created with CO2 and tolerated well without any adverse changes in the patient's vital signs. A 5mm port was placed through the aforementioned incision and the abdominal cavity was explored. Evidence of a prior mesh repair to an umbilical hernia was identified. This caused the umbilical port to be  placed supraumbilical Two 5-mm ports were placed, one in the supraumbilical and one in the right upper quadrant and a 12 mm epigastric port was placed all under direct vision. All skin incisions  were infiltrated with a local anesthetic agent before making the incision and placing the trocars.   The patient was positioned  in reverse Trendelenburg, tilted slightly to the patient's left. Extensive inflammatory lesions were noted in the right upper quadrant. The omentum was noted to be adhered to the anterior abdominal wall up to the dome of the liver. This was taken down using accommodation of blunt dissection and Bovie electrocautery. As the omentum was taken down a cavity of bilious fluid was encountered that connected to the dome of the gallbladder consistent with a gangrenous perforation of the gallbladder. The remainder of the inflammatory attachments to the gallbladder were taken down with accommodation of blunt dissection with a suction irrigator and Bovie electrocautery.  The gallbladder was identified, the fundus grasped and retracted cephalad. Adhesions were lysed bluntly. The infundibulum was grasped and retracted laterally, exposing the peritoneum overlying the triangle of Calot. This was then divided and exposed in a blunt fashion. A critical view of the cystic duct and cystic artery was obtained.  The cystic duct was clearly identified and bluntly dissected.   After the critical view the cystic duct and artery were noted to be of normal caliber and were serially clipped in the standard fashion with 3 sequential clips. They were sharply incised with Endo Shears between the aforementioned clips.  The gallbladder was taken from the gallbladder fossa in a retrograde fashion with the electrocautery. The gallbladder was removed and placed in an Endocatch bag. The liver bed was irrigated and inspected. Hemostasis was achieved with the electrocautery. Copious irrigation was utilized and was repeatedly  aspirated  until clear.  The gallbladder and Endocatch sac were then removed through the epigastric port site.   Due to the size of the gallbladder the epigastric trocar site had to be enlarged sharply and bluntly to allow the gallbladder to be removed. Due to the extensive inflammation in the right upper quadrant incision was made to place a 19 [place] round Blake drain into the abdomen. He was placed through the epigastric trocar site exiting the most lateral right upper quadrant site. It was visually placed in the peri-hepatic space going across the previously placed clips. The drain was then secured with a 3-0 nylon suture.  Inspection of the right upper quadrant was performed. No bleeding, bile duct injury or leak, or bowel injury was noted. Pneumoperitoneum was released.  The epigastric port site was closed with figure-of-eight 0 Vicryl sutures. 4-0 subcuticular Monocryl was used to close the skin. Steristrips and Mastisol and sterile dressings were  applied.  The patient was then extubated and brought to the recovery room in stable condition. Sponge, lap, and needle counts were correct at closure and at the conclusion of the case.   Findings: Gangrenous Cholecystitis   Estimated Blood Loss: 50 mL         Drains: 19 French round Blake drain         Specimens: Gallbladder           Complications: none               Destane Speas T. Tonita CongWoodham, MD, FACS

## 2017-06-26 NOTE — Progress Notes (Signed)
15 minute call to floor. 

## 2017-06-27 DIAGNOSIS — R000 Tachycardia, unspecified: Secondary | ICD-10-CM | POA: Insufficient documentation

## 2017-06-27 LAB — SURGICAL PATHOLOGY

## 2017-06-27 MED ORDER — AMOXICILLIN-POT CLAVULANATE 875-125 MG PO TABS: 1.0000 | ORAL_TABLET | Freq: Two times a day (BID) | ORAL | 0 refills | Status: DC

## 2017-06-27 MED ORDER — ALUM & MAG HYDROXIDE-SIMETH 200-200-20 MG/5ML PO SUSP
30.0000 mL | ORAL | Status: DC | PRN
  Administered 2017-06-27: 30 mL via ORAL
  Filled 2017-06-27: qty 30

## 2017-06-27 MED ORDER — AMOXICILLIN-POT CLAVULANATE 875-125 MG PO TABS
1.0000 | ORAL_TABLET | Freq: Two times a day (BID) | ORAL | Status: DC
  Administered 2017-06-27: 1 via ORAL
  Filled 2017-06-27: qty 1

## 2017-06-27 MED ORDER — HYDROCODONE-ACETAMINOPHEN 5-325 MG PO TABS: 1.0000 | ORAL_TABLET | ORAL | 0 refills | Status: DC | PRN

## 2017-06-27 NOTE — Discharge Summary (Signed)
Patient ID: Kevin Valentine MRN: [id number] DOB/AGE: 74/01/1941 74 y.o.  Admit date: 06/26/2017 Discharge date: 06/27/2017  Discharge Diagnoses:  Acute cholecystitis  Procedures Performed: Laparoscopic cholecystectomy  Discharged Condition: good  Hospital Course: Patient admitted with acute cholecystitis. Underwent a laparoscopic cholecystectomy without difficulty. On the day of discharge his abdomen was soft, nontender, nondistended. Had a JP drain in place draining a serous output. He is tolerating a diet and having regular bowel function.  Discharge Orders: Discharge Instructions    Call MD for:  persistant nausea and vomiting    Complete by:  As directed    Call MD for:  severe uncontrolled pain    Complete by:  As directed    Call MD for:  temperature >100.4    Complete by:  As directed    Diet - low sodium heart healthy    Complete by:  As directed    Increase activity slowly    Complete by:  As directed       Disposition: 01-Home or Self Care  Discharge Medications: Allergies as of 06/27/2017   No Known Allergies     Medication List    TAKE these medications   amoxicillin-clavulanate 875-125 MG tablet Commonly known as:  AUGMENTIN Take 1 tablet by mouth every 12 (twelve) hours.   aspirin EC 81 MG tablet Take 81 mg by mouth daily.   HYDROcodone-acetaminophen 5-325 MG tablet Commonly known as:  NORCO/VICODIN Take 1-2 tablets by mouth every 4 (four) hours as needed for moderate pain or severe pain.        Follwup: Follow-up Information    [facility]. Go in 3 day(s).   Specialty:  General Surgery Why:  post op - drain in place Contact information: [address] [place] [id number] [phone]          Signed: Ricarda FrameCharles Melven Valentine 06/27/2017, 1:24 PM

## 2017-06-27 NOTE — Care Management Important Message (Signed)
Important Message  Patient Details  Name: Kevin Valentine MRN: [id number] Date of Birth: [birth year]   Medicare Important Message Given:  N/A - LOS <3 / Initial given by admissions    Chapman FitchBOWEN, Berea Majkowski T, RN 06/27/2017, 2:46 PM

## 2017-06-27 NOTE — Discharge Instructions (Signed)
Laparoscopic Cholecystectomy, Care After °This sheet gives you information about how to care for yourself after your procedure. Your health care provider may also give you more specific instructions. If you have problems or questions, contact your health care provider. °What can I expect after the procedure? °After the procedure, it is common to have: °· Pain at your incision sites. You will be given medicines to control this pain. °· Mild nausea or vomiting. °· Bloating and possible shoulder pain from the air-like gas that was used during the procedure. °Follow these instructions at home: °Incision care  ° °· Follow instructions from your health care provider about how to take care of your incisions. Make sure you: °¨ Wash your hands with soap and water before you change your bandage (dressing). If soap and water are not available, use hand sanitizer. °¨ Change your dressing as told by your health care provider. °¨ Leave stitches (sutures), skin glue, or adhesive strips in place. These skin closures may need to be in place for 2 weeks or longer. If adhesive strip edges start to loosen and curl up, you may trim the loose edges. Do not remove adhesive strips completely unless your health care provider tells you to do that. °· Do not take baths, swim, or use a hot tub until your health care provider approves. Ask your health care provider if you can take showers. You may only be allowed to take sponge baths for bathing. °· Check your incision area every day for signs of infection. Check for: °¨ More redness, swelling, or pain. °¨ More fluid or blood. °¨ Warmth. °¨ Pus or a bad smell. °Activity  °· Do not drive or use heavy machinery while taking prescription pain medicine. °· Do not lift anything that is heavier than 10 lb (4.5 kg) until your health care provider approves. °· Do not play contact sports until your health care provider approves. °· Do not drive for 24 hours if you were given a medicine to help you relax  (sedative). °· Rest as needed. Do not return to work or school until your health care provider approves. °General instructions  °· Take over-the-counter and prescription medicines only as told by your health care provider. °· To prevent or treat constipation while you are taking prescription pain medicine, your health care provider may recommend that you: °¨ Drink enough fluid to keep your urine clear or pale yellow. °¨ Take over-the-counter or prescription medicines. °¨ Eat foods that are high in fiber, such as fresh fruits and vegetables, whole grains, and beans. °¨ Limit foods that are high in fat and processed sugars, such as fried and sweet foods. °Contact a health care provider if: °· You develop a rash. °· You have more redness, swelling, or pain around your incisions. °· You have more fluid or blood coming from your incisions. °· Your incisions feel warm to the touch. °· You have pus or a bad smell coming from your incisions. °· You have a fever. °· One or more of your incisions breaks open. °Get help right away if: °· You have trouble breathing. °· You have chest pain. °· You have increasing pain in your shoulders. °· You faint or feel dizzy when you stand. °· You have severe pain in your abdomen. °· You have nausea or vomiting that lasts for more than one day. °· You have leg pain. °This information is not intended to replace advice given to you by your health care provider. Make sure you discuss any   questions you have with your health care provider. °Document Released: 11/28/2005 Document Revised: 06/18/2016 Document Reviewed: 05/16/2016 °Elsevier Interactive Patient Education © 2017 [employer] ° °

## 2017-06-27 NOTE — Progress Notes (Signed)
06/27/2017  15:40  Sherie DonFranklin D Orth to be D/C'd Home per MD order.  Discussed prescriptions and follow up appointments with the patient. Prescriptions given to patient, medication list explained in detail. Pt verbalized understanding.  Allergies as of 06/27/2017   No Known Allergies     Medication List    TAKE these medications   amoxicillin-clavulanate 875-125 MG tablet Commonly known as:  AUGMENTIN Take 1 tablet by mouth every 12 (twelve) hours.   aspirin EC 81 MG tablet Take 81 mg by mouth daily.   HYDROcodone-acetaminophen 5-325 MG tablet Commonly known as:  NORCO/VICODIN Take 1-2 tablets by mouth every 4 (four) hours as needed for moderate pain or severe pain.       Vitals:   06/27/17 0508 06/27/17 1210  BP: 116/67 118/64  Pulse: (!) 54 65  Resp: (!) 22   Temp: 98.1 F (36.7 C)     Skin clean, dry and intact without evidence of skin break down, no evidence of skin tears noted. IV catheter discontinued intact. Site without signs and symptoms of complications. Dressing and pressure applied. Pt denies pain at this time. No complaints noted.  An After Visit Summary was printed and given to the patient. Patient escorted via WC, and D/C home via private auto.  Bradly Chrisougherty, Dariyon Urquilla E

## 2017-06-28 ENCOUNTER — Telehealth: Payer: Self-pay

## 2017-06-28 NOTE — Anesthesia Postprocedure Evaluation (Signed)
Anesthesia Post Note  Patient: Kevin Valentine  Procedure(s) Performed: Procedure(s) (LRB): LAPAROSCOPIC CHOLECYSTECTOMY (N/A)  Patient location during evaluation: PACU Anesthesia Type: General Level of consciousness: awake and alert Pain management: pain level controlled Vital Signs Assessment: post-procedure vital signs reviewed and stable Respiratory status: spontaneous breathing, nonlabored ventilation, respiratory function stable and patient connected to nasal cannula oxygen Cardiovascular status: blood pressure returned to baseline and stable Postop Assessment: no signs of nausea or vomiting Anesthetic complications: no     Last Vitals:  Vitals:   06/27/17 0508 06/27/17 1210  BP: 116/67 118/64  Pulse: (!) 54 65  Resp: (!) 22   Temp: 36.7 C     Last Pain:  Vitals:   06/27/17 0508  TempSrc: Oral  PainSc:                  Yevette EdwardsJames G Adams

## 2017-06-28 NOTE — Telephone Encounter (Signed)
Patient is returning the post op call that was made to him today.   1.       How are you feeling? Patient states he is feeling good.   2.         Is your pain controlled? Patient is not feeling any pain   3.         What are you doing for the pain? He took an Ibuprofen last night just incase he               Had any pains waking up.   4.         Are you having any Nausea or Vomiting? Patient denies Nausea & vomiting   5.         Are you having any Fever or Chills? Patient denies Fever & Chills  6.         Are you having any Constipation or Diarrhea? Constipation but that's to be                      He states that's to be expected   7.         Is there any Swelling or Bruising you are concerned about? Patient denies                      swelling & bruising  8.         Do you have any questions or concerns at this time? The only comment that                  the he has at this time is that his drain is not draining very much. He has an                    appointment scheduled for tomorrow. I let him know that it is normal and we will             check it tomorrow at his appointment.     Discussion: Drain doing okay? Besides the comment mentioned on #8, everything is fine

## 2017-06-28 NOTE — Telephone Encounter (Signed)
Post-op call made to patient at this time. Message was left for patient to call with any questions or concerns that he may have. He has a follow up appointment tomorrow 7/19 with Dr. Aleen CampiPiscoya at 11:15AM. If patient calls back please ask the following    1. How are you feeling?   2. Is your pain controlled?   3. What are you doing for the pain?   4. Are you having any Nausea or Vomiting?   5. Are you having any Fever or Chills?   6. Are you having any Constipation or Diarrhea?   7. Is there any Swelling or Bruising you are concerned about?   8. Do you have any questions or concerns at this time?    Discussion: Drain doing okay?

## 2017-06-29 ENCOUNTER — Ambulatory Visit (INDEPENDENT_AMBULATORY_CARE_PROVIDER_SITE_OTHER): Payer: Medicare Other | Admitting: Surgery

## 2017-06-29 ENCOUNTER — Encounter: Payer: Self-pay | Admitting: Surgery

## 2017-06-29 VITALS — BP 156/87 | HR 64 | Temp 98.1°F | Ht 72.0 in | Wt 236.0 lb

## 2017-06-29 DIAGNOSIS — Z09 Encounter for follow-up examination after completed treatment for conditions other than malignant neoplasm: Secondary | ICD-10-CM

## 2017-06-29 NOTE — Patient Instructions (Signed)
Please do not submerge in a tub, hot tub, or pool until incisions are completely sealed.  Use sun block to incision area over the next year if this area will be exposed to sun. This helps decrease scarring.  You may resume your normal activities on 08/07/17. At that time- Listen to your body when lifting, if you have pain when lifting, stop and then try again in a few days. Soreness after doing exercises or activities of daily living is normal as you get back in to your normal routine.  If you develop redness, drainage, or pain at incision sites- call our office immediately and speak with a nurse.  Please call our office

## 2017-06-29 NOTE — Progress Notes (Signed)
06/29/2017  HPI: Patient is status post laparoscopic cholecystectomy on 7/16 with Dr. Tonita CongWoodham for perforated gangrenous cholecystitis.  Had a JP drain left in place and was discharged on 7/17. He presents today for her first postop follow-up. Reports that he's doing very well with no worsening pain. Denies any nausea or vomiting. He is tolerating a regular diet with normal bowel movements and good appetite. His JP drain has been draining serosanguineous fluid and has been low volume of less than 20 cc each day.  Vital signs: BP (!) 156/87   Pulse 64   Temp 98.1 F (36.7 C) (Oral)   Ht 6' (1.829 m)   Wt 107 kg (236 lb)   BMI 32.01 kg/m    Physical Exam: Constitutional: No acute distress Abdomen:  Soft, nondistended, with minimal tenderness to palpation over the incisions. All incisions are clean dry and intact covered with Steri-Strips. Is no evidence of infection. JP drain with serosanguineous fluid.  Assessment/Plan: 76 year old male status post laparoscopic cholecystectomy.  -JP drain removed with no complications. Dry gauze dressing with tape applied. Discussed with the patient he should change his dressing once daily or as needed. The patient is aware that the drain site will start healing and seal up over the next few days. -Patient is a has a no heavy lifting restriction of no more than 10-15 pounds for a period of 4 weeks. Other lighter activities are appropriate.  Patient should continue taking his antibiotics as prescribed. -Acute pathology with the patient. Acute cholecystitis and cholelithiasis with cholesterolosis but negative for any dysplasia or malignancy. -Patient will follow-up next week with Dr. Tonita CongWoodham to see how he progresses once he is off antibiotics.   Howie IllJose Luis Carnesha Maravilla, MD [facility]

## 2017-07-05 ENCOUNTER — Encounter: Payer: Self-pay | Admitting: General Surgery

## 2017-07-07 ENCOUNTER — Encounter: Payer: Self-pay | Admitting: General Surgery

## 2017-07-07 ENCOUNTER — Ambulatory Visit (INDEPENDENT_AMBULATORY_CARE_PROVIDER_SITE_OTHER): Payer: Medicare Other | Admitting: General Surgery

## 2017-07-07 VITALS — BP 118/81 | HR 76 | Temp 97.6°F | Wt 230.0 lb

## 2017-07-07 DIAGNOSIS — Z4889 Encounter for other specified surgical aftercare: Secondary | ICD-10-CM

## 2017-07-07 NOTE — Progress Notes (Signed)
Outpatient Surgical Follow Up  07/07/2017  Kevin Valentine is an 76 y.o. male.   Chief Complaint  Patient presents with  . Routine Post Op    Laparoscopic Cholecystectomy (06/26/17)- Dr. Tonita CongWoodham    HPI: 76 year old male returns to clinic for follow-up 10 days status post laparoscopic cholecystectomy. Patient reports doing very well. Denies any pain. He is eating well and having normal bowel function. He states he's had no pain and has been very happy with the surgical results. Denies any fevers, chills, nausea, vomiting, chest pain, shortness breath, diarrhea, constipation.  No past medical history on file.  Past Surgical History:  Procedure Laterality Date  . ANKLE FUSION Right   . CHOLECYSTECTOMY N/A 06/26/2017   Procedure: LAPAROSCOPIC CHOLECYSTECTOMY;  Surgeon: Ricarda FrameWoodham, Ayari Liwanag, MD;  Location: [facility] ORS;  Service: General;  Laterality: N/A;  . HERNIA REPAIR      No family history on file.  Social History:  reports that he has never smoked. He has never used smokeless tobacco. He reports that he does not drink alcohol or use drugs.  Allergies: No Known Allergies  Medications reviewed.    ROS A multipoint review of systems was completed, all pertinent positives and negatives are documented within the history of present illness the remainder are negative   BP 118/81   Pulse 76   Temp 97.6 F (36.4 C) (Oral)   Wt 104.3 kg (230 lb)   BMI 31.19 kg/m   Physical Exam  Gen.: No acute distress  chest: Clear to auscultation  heart: Regular rate and rhythm Abdomen: Soft, nontender, nondistended. Well approximated laparoscopic incision sites on the evidence of erythema or drainage.    No results found for this or any previous visit (from the past 48 hour(s)). No results found.  Assessment/Plan:  1. Aftercare following surgery 76 year old male status post laparoscopic cholecystectomy. Doing very well. Pathology reviewed with the patient. Provided with standard  postoperative precautions and return indications. He voiced understanding and will follow-up in clinic on an as-needed basis.     Ricarda Frameharles Sherley Leser, MD FACS General Surgeon  07/07/2017,1:05 PM

## 2017-07-07 NOTE — Patient Instructions (Addendum)
Please do not submerge in a tub, hot tub, or pool until incisions are completely sealed.  Use sun block to incision area over the next year if this area will be exposed to sun. This helps decrease scarring.  You may resume your normal activities. However, until 08/07/2017 you have a 15 lbs lifting restriction. At that time- Listen to your body when lifting, if you have pain when lifting, stop and then try again in a few days. Soreness after doing exercises or activities of daily living is normal as you get back in to your normal routine.  If you develop redness, drainage, or pain at incision sites- call our office immediately and speak with a nurse.  Please call our office with any questions or concerns that you may have.

## 2021-05-12 ENCOUNTER — Other Ambulatory Visit: Payer: Self-pay | Admitting: Neurology

## 2021-05-12 DIAGNOSIS — G20 Parkinson's disease: Secondary | ICD-10-CM

## 2021-05-25 ENCOUNTER — Ambulatory Visit
Admission: RE | Admit: 2021-05-25 | Discharge: 2021-05-25 | Disposition: A | Discharge status: Home / Self Care | Payer: Medicare Other | Source: Ambulatory Visit | Attending: Neurology | Admitting: Neurology

## 2021-05-25 ENCOUNTER — Other Ambulatory Visit: Payer: Self-pay

## 2021-05-25 DIAGNOSIS — G20 Parkinson's disease: Secondary | ICD-10-CM | POA: Diagnosis not present

## 2021-06-30 IMAGING — MR MR HEAD W/O CM
10 series · 48 of 48 positions shown · non-contrast
Comparison: [id number]

CLINICAL DATA: Parkinsonian tremor; technologist note states left
hand tremor, headache

EXAM:
MRI HEAD WITHOUT CONTRAST
TECHNIQUE: Multiplanar, multiecho pulse sequences of the brain and surrounding
structures were obtained without intravenous contrast.

[Series 2: DWI · axial · 3.0mm · 1.20mm/px · z∈[-105,+57]mm · 9 of 108 slices shown (1 of 4)]
[im 1/108]
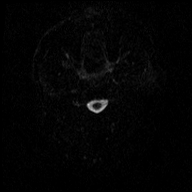
[im 14/108]
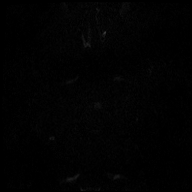
[im 27/108]
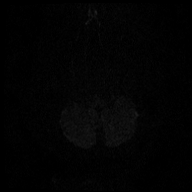
[im 41/108]
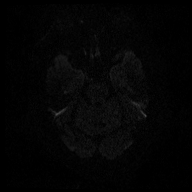
[im 54/108]
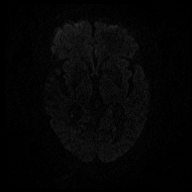
[im 67/108]
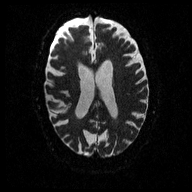
[im 81/108]
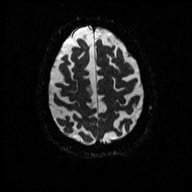
[im 94/108]
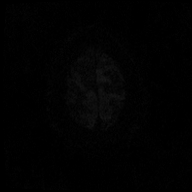
[im 108/108]
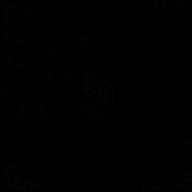

[Series 3: DWI · axial · 3.0mm · 1.20mm/px · z∈[-105,+57]mm · 4 of 55 slices shown (2 of 4)]
[im 1/55]
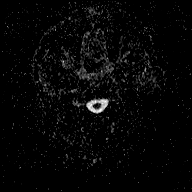
[im 19/55]
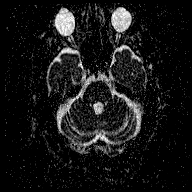
[im 37/55]
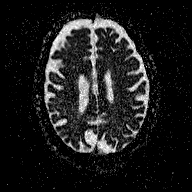
[im 55/55]
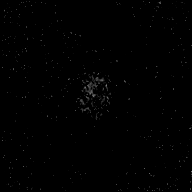

[Series 4: DWI · coronal · 3.0mm · 1.15mm/px · 7 of 96 slices shown (3 of 4)]
[im 1/96]
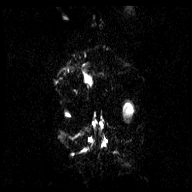
[im 16/96]
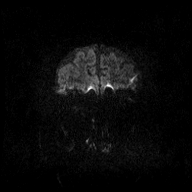
[im 32/96]
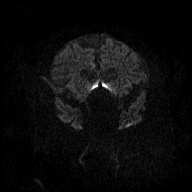
[im 48/96]
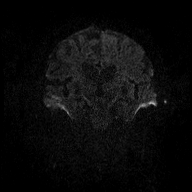
[im 64/96]
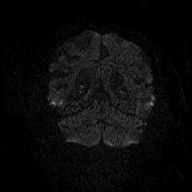
[im 80/96]
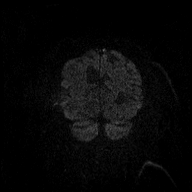
[im 96/96]
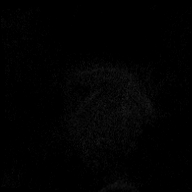

[Series 5: DWI · coronal · 3.0mm · 1.15mm/px · 4 of 48 slices shown (4 of 4)]
[im 1/48]
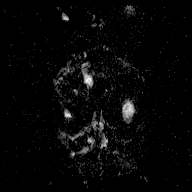
[im 16/48]
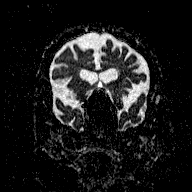
[im 32/48]
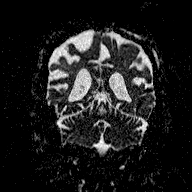
[im 48/48]
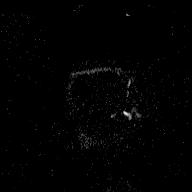

[Series 6: T1 · sagittal · 5.0mm · 0.45mm/px · 2 of 23 slices shown (1 of 2)]
[im 1/23]
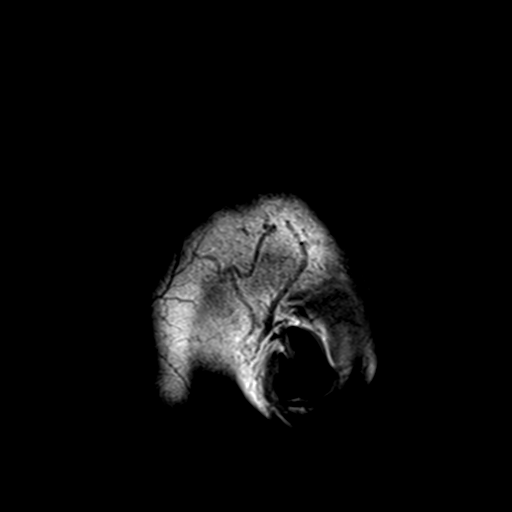
[im 23/23]
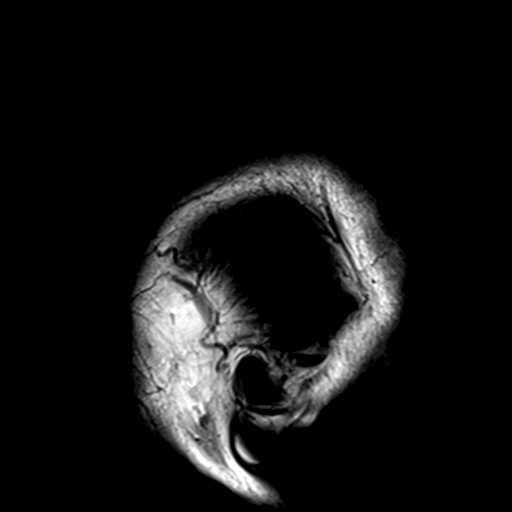

[Series 7: T2 · axial · 5.0mm · 0.72mm/px · z∈[-101,+53]mm · 2 of 23 slices shown (1 of 3)]
[im 1/23]
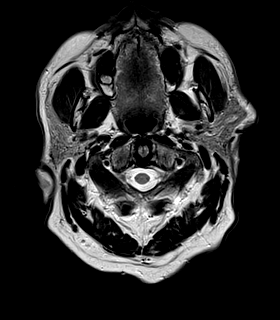
[im 23/23]
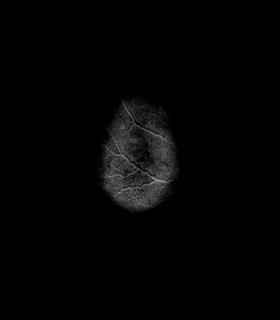

[Series 8: FLAIR · axial · 3.0mm · 0.45mm/px · z∈[-105,+57]mm · 4 of 55 slices shown]
[im 1/55]
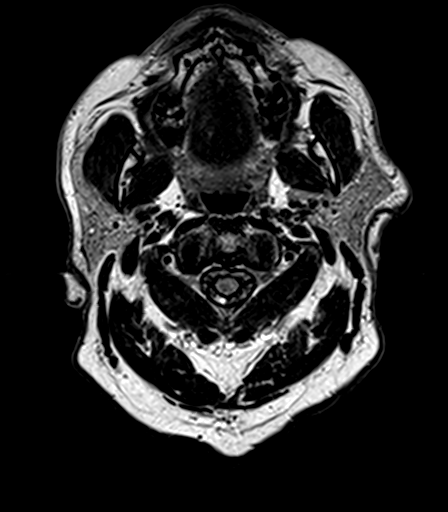
[im 19/55]
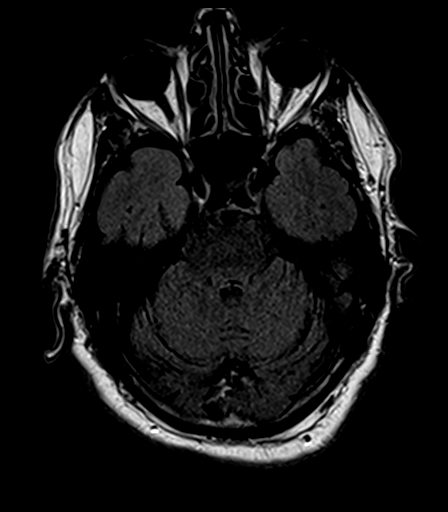
[im 37/55]
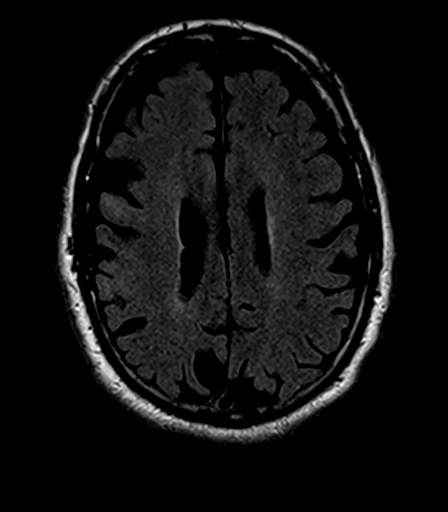
[im 55/55]
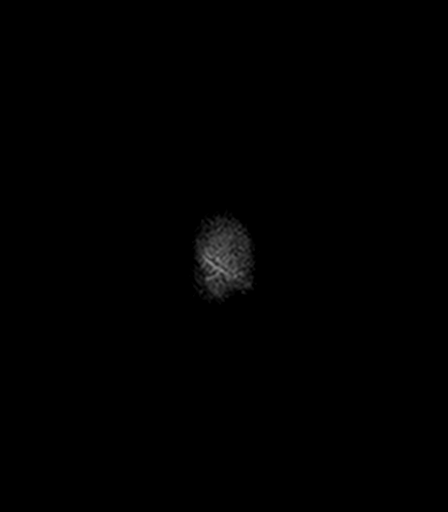

[Series 9: T2 · axial · 5.0mm · 0.72mm/px · z∈[-101,+53]mm · 2 of 23 slices shown (2 of 3)]
[im 1/23]
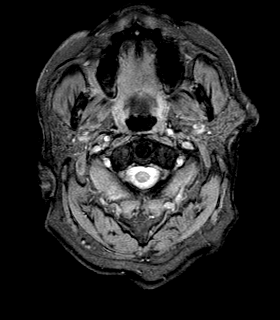
[im 23/23]
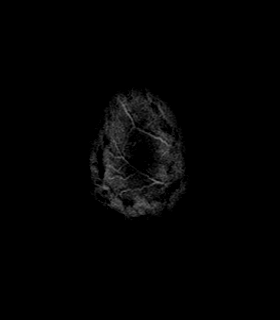

[Series 10: T1 · axial · 1.0mm · 1.00mm/px · z∈[-103,+56]mm · 12 of 160 slices shown (2 of 2)]
[im 1/160]
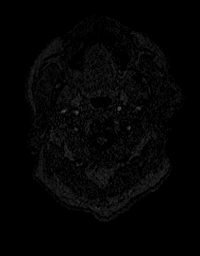
[im 15/160]
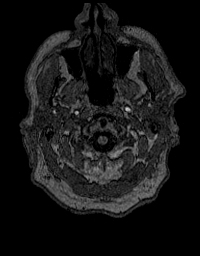
[im 29/160]
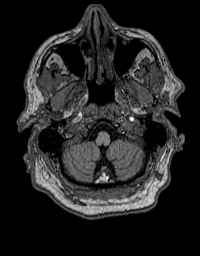
[im 44/160]
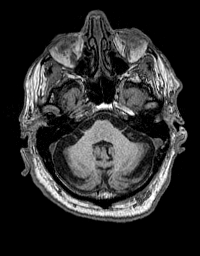
[im 58/160]
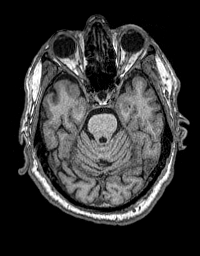
[im 73/160]
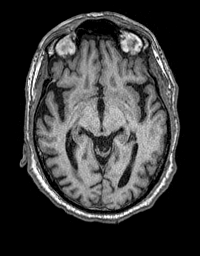
[im 87/160]
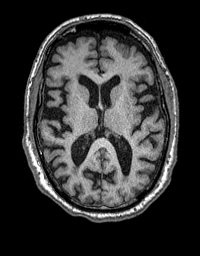
[im 102/160]
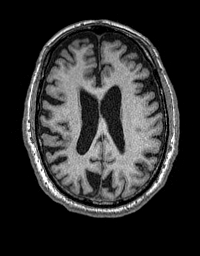
[im 116/160]
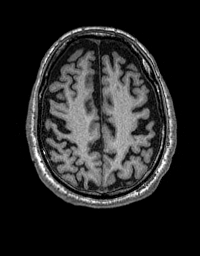
[im 131/160]
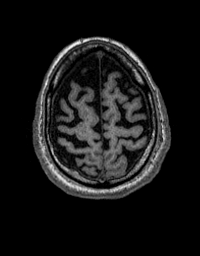
[im 145/160]
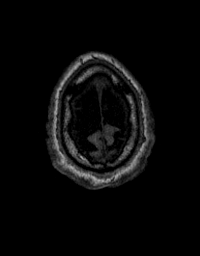
[im 160/160]
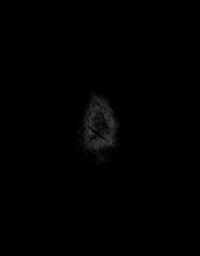

[Series 11: T2 · coronal · 5.0mm · 0.43mm/px · 2 of 29 slices shown (3 of 3)]
[im 1/29]
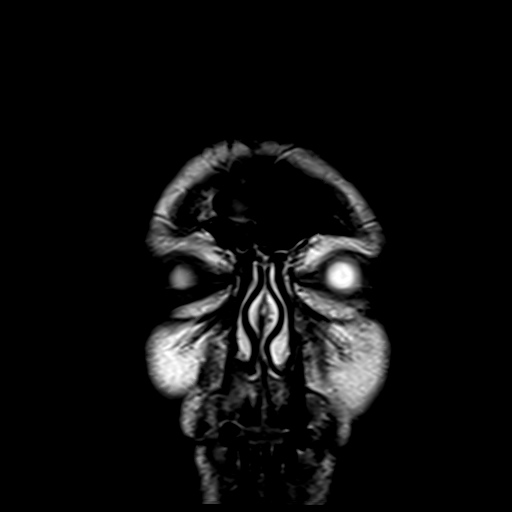
[im 29/29]
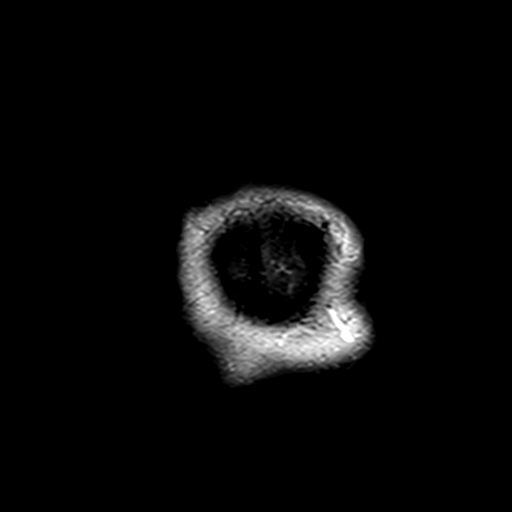

[48 of 48 positions shown; findings below may reference images not displayed]

FINDINGS: Brain: There is no acute infarction or intracranial hemorrhage.
There is no intracranial mass, mass effect, or edema. There is no
hydrocephalus or extra-axial fluid collection. Prominence of the
ventricles and sulci reflects generalized parenchymal volume loss
that is stable to slightly progressed from the prior study. Patchy
T2 hyperintensity in the supratentorial white matter is nonspecific
but may reflect mild chronic microvascular ischemic changes.

Vascular: Major vessel flow voids at the skull base are preserved.

Skull and upper cervical spine: Normal marrow signal is preserved.

Sinuses/Orbits: Trace paranasal sinus mucosal thickening. Bilateral
lens replacements.

Other: Sella is unremarkable.  Mastoid air cells are clear.
IMPRESSION: No evidence of recent infarction, hemorrhage, or mass. Mild chronic
microvascular ischemic changes. Parenchymal volume loss is stable to
slightly progressed from the prior study.

## 2022-10-26 ENCOUNTER — Ambulatory Visit (INDEPENDENT_AMBULATORY_CARE_PROVIDER_SITE_OTHER): Payer: Medicare Other | Admitting: Dermatology

## 2022-10-26 ENCOUNTER — Encounter: Payer: Self-pay | Admitting: Dermatology

## 2022-10-26 DIAGNOSIS — L821 Other seborrheic keratosis: Secondary | ICD-10-CM

## 2022-10-26 DIAGNOSIS — L570 Actinic keratosis: Secondary | ICD-10-CM | POA: Diagnosis not present

## 2022-10-26 DIAGNOSIS — L578 Other skin changes due to chronic exposure to nonionizing radiation: Secondary | ICD-10-CM

## 2022-10-26 DIAGNOSIS — L814 Other melanin hyperpigmentation: Secondary | ICD-10-CM

## 2022-10-26 DIAGNOSIS — D692 Other nonthrombocytopenic purpura: Secondary | ICD-10-CM

## 2022-10-26 NOTE — Progress Notes (Signed)
Follow-Up Visit   Subjective  Kevin Valentine is a 82 y.o. male who presents for the following: Check several spots (Arms, no symptoms.). Patient declines UBSE.   Patient with Corticobasal syndrome - Parkinsonism.   The following portions of the chart were reviewed this encounter and updated as appropriate:       Review of Systems:  No other skin or systemic complaints except as noted in HPI or Assessment and Plan.  Objective  Well appearing patient in no apparent distress; mood and affect are within normal limits.  A focused examination was performed including face, arms. Relevant physical exam findings are noted in the Assessment and Plan.  R nasal tip x 1, nasal dorsum x 1 (2) Pink scaly macule    Assessment & Plan  Actinic Damage - chronic, secondary to cumulative UV radiation exposure/sun exposure over time - diffuse scaly erythematous macules with underlying dyspigmentation - Recommend daily broad spectrum sunscreen SPF 30+ to sun-exposed areas, reapply every 2 hours as needed.  - Recommend staying in the shade or wearing long sleeves, sun glasses (UVA+UVB protection) and wide brim hats (4-inch brim around the entire circumference of the hat). - Call for new or changing lesions.  Lentigines - Scattered tan macules arms and face - Due to sun exposure - Benign-appearing, observe - Recommend daily broad spectrum sunscreen SPF 30+ to sun-exposed areas, reapply every 2 hours as needed. - Call for any changes  Seborrheic Keratoses - Stuck-on, waxy, tan-brown papules and/or plaques, including arms and face  - Benign-appearing - Discussed benign etiology and prognosis. - Observe - Call for any changes  Purpura - Chronic; persistent and recurrent.  Treatable, but not curable. 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 They are chronic with expected duration over 1 year. A portion of actinic keratoses will progress to squamous cell carcinoma of the skin. It is not possible to reliably predict which spots will progress to skin cancer and so treatment is recommended to prevent development of skin cancer.  Recommend daily broad spectrum sunscreen SPF 30+ to sun-exposed areas, reapply every 2 hours as needed.  Recommend staying in the shade or wearing long sleeves, sun glasses (UVA+UVB protection) and wide brim hats (4-inch brim around the entire circumference of the hat). Call for new or changing lesions.  Destruction of lesion - R nasal tip x 1, nasal dorsum x 1  Destruction method: cryotherapy   Informed consent: discussed and consent obtained   Lesion destroyed using liquid nitrogen: Yes   Region frozen until ice ball extended beyond lesion: Yes   Outcome: patient tolerated procedure well with no complications   Post-procedure details: wound care instructions given   Additional details:  Prior to procedure, discussed risks of blister formation, small wound, skin dyspigmentation, or rare scar following cryotherapy. Recommend Vaseline ointment to treated areas while healing.    Return in about 1 year (around 10/27/2023) for Hx AKs.  Wendee Beavers, CMA, am acting as scribe for Willeen Niece, MD .  Documentation: I have reviewed the above documentation for accuracy  and completeness, and I agree with the above.  Willeen Niece MD

## 2022-10-26 NOTE — Patient Instructions (Addendum)
Cryotherapy Aftercare - nose  Wash gently with soap and water everyday.   Apply Vaseline and Band-Aid daily until healed.    Seborrheic Keratosis  What causes seborrheic keratoses? Seborrheic keratoses are harmless, common skin growths that first appear during adult life.  As time goes by, more growths appear.  Some people may develop a large number of them.  Seborrheic keratoses appear on both covered and uncovered body parts.  They are not caused by sunlight.  The tendency to develop seborrheic keratoses can be inherited.  They vary in color from skin-colored to gray, brown, or even black.  They can be either smooth or have a rough, warty surface.   Seborrheic keratoses are superficial and look as if they were stuck on the skin.  Under the microscope this type of keratosis looks like layers upon layers of skin.  That is why at times the top layer may seem to fall off, but the rest of the growth remains and re-grows.    Treatment Seborrheic keratoses do not need to be treated, but can easily be removed in the office.  Seborrheic keratoses often cause symptoms when they rub on clothing or jewelry.  Lesions can be in the way of shaving.  If they become inflamed, they can cause itching, soreness, or burning.  Removal of a seborrheic keratosis can be accomplished by freezing, burning, or surgery. If any spot bleeds, scabs, or grows rapidly, please return to have it checked, as these can be an indication of a skin cancer.  Due to recent changes in healthcare laws, you may see results of your pathology and/or laboratory studies on MyChart before the doctors have had a chance to review them. We understand that in some cases there may be results that are confusing or concerning to you. Please understand that not all results are received at the same time and often the doctors may need to interpret multiple results in order to provide you with the best plan of care or course of treatment. Therefore, we ask  that you please give [place] 2 business days to thoroughly review all your results before contacting the office for clarification. Should we see a critical lab result, you will be contacted sooner.   If You Need Anything After Your Visit  If you have any questions or concerns for your doctor, please call our main line at [phone] and press option 4 to reach your doctor's medical assistant. If no one answers, please leave a voicemail as directed and we will return your call as soon as possible. Messages left after 4 pm will be answered the following business day.   You may also send [place] a message via MyChart. We typically respond to MyChart messages within 1-2 business days.  For prescription refills, please ask your pharmacy to contact our office. Our fax number is [phone].  If you have an urgent issue when the clinic is closed that cannot wait until the next business day, you can page your doctor at the number below.    Please note that while we do our best to be available for urgent issues outside of office hours, we are not available 24/7.   If you have an urgent issue and are unable to reach [place], you may choose to seek medical care at your doctor's office, retail clinic, urgent care center, or emergency room.  If you have a medical emergency, please immediately call 911 or go to the emergency department.  Pager Numbers  - Dr. Gwen Pounds:  [phone]  - Dr. Laurence Ferrari: [phone]  - Dr. Nicole Kindred: [phone]  In the event of inclement weather, please call our main line at [phone] for an update on the status of any delays or closures.  Dermatology Medication Tips: Please keep the boxes that topical medications come in in order to help keep track of the instructions about where and how to use these. Pharmacies typically print the medication instructions only on the boxes and not directly on the medication tubes.   If your medication is too expensive, please contact our office at  [phone] option 4 or send [place] a message through [place].   We are unable to tell what your co-pay for medications will be in advance as this is different depending on your insurance coverage. However, we may be able to find a substitute medication at lower cost or fill out paperwork to get insurance to cover a needed medication.   If a prior authorization is required to get your medication covered by your insurance company, please allow [place] 1-2 business days to complete this process.  Drug prices often vary depending on where the prescription is filled and some pharmacies may offer cheaper prices.  The website www.goodrx.com contains coupons for medications through different pharmacies. The prices here do not account for what the cost may be with help from insurance (it may be cheaper with your insurance), but the website can give you the price if you did not use any insurance.  - You can print the associated coupon and take it with your prescription to the pharmacy.  - You may also stop by our office during regular business hours and pick up a GoodRx coupon card.  - If you need your prescription sent electronically to a different pharmacy, notify our office through [facility] or by phone at [phone] option 4.     Si Usted Necesita Algo Despus de Su Visita  Tambin puede enviarnos un mensaje a travs de Pharmacist, community. Por lo general respondemos a los mensajes de MyChart en el transcurso de 1 a 2 das hbiles.  Para renovar recetas, por favor pida a su farmacia que se ponga en contacto con nuestra oficina. Harland Dingwall de fax es [place] [phone].  Si tiene un asunto urgente cuando la clnica est cerrada y que no puede esperar hasta el siguiente da hbil, puede llamar/localizar a su doctor(a) al nmero que aparece a continuacin.   Por favor, tenga en cuenta que aunque hacemos todo lo posible para estar disponibles para asuntos urgentes fuera del horario de [place], no estamos  disponibles las 24 horas del da, los 7 das de la [place].   Si tiene un problema urgente y no puede comunicarse con nosotros, puede optar por buscar atencin mdica  en el consultorio de su doctor(a), en una clnica privada, en un centro de atencin urgente o en una sala de emergencias.  Si tiene Engineering geologist, por favor llame inmediatamente al 911 o vaya a la sala de emergencias.  Nmeros de bper  - Dr. Nehemiah Massed: [phone]  - Dra. Moye: [phone]  - Dra. Nicole Kindred: [phone]  En caso de inclemencias del [place], por favor llame a Johnsie Kindred principal al [phone] para una actualizacin sobre el [place] de cualquier retraso o cierre.  Consejos para la medicacin en dermatologa: Por favor, guarde las cajas en las que vienen los medicamentos de uso tpico para ayudarle a seguir las instrucciones sobre dnde y cmo usarlos. Las farmacias generalmente imprimen las instrucciones del medicamento slo en  no directamente en los tubos del medicamento.   Si su medicamento es muy caro, por favor, pngase en contacto con nuestra oficina llamando al [phone] y presione la opcin 4 o envenos un mensaje a travs de MyChart.   No podemos decirle cul ser su copago por los medicamentos por adelantado ya que esto es diferente dependiendo de la cobertura de su seguro. Sin embargo, es posible que podamos encontrar un medicamento sustituto a menor costo o llenar un formulario para que el seguro cubra el medicamento que se considera necesario.   Si se requiere una autorizacin previa para que su compaa de seguros cubra su medicamento, por favor permtanos de 1 a 2 das hbiles para completar este proceso.  Los precios de los medicamentos varan con frecuencia dependiendo del lugar de dnde se surte la receta y alguna farmacias pueden ofrecer precios ms baratos.  El sitio web www.goodrx.com tiene cupones para medicamentos de diferentes farmacias. Los precios aqu no  tienen en cuenta lo que podra costar con la ayuda del seguro (puede ser ms barato con su seguro), pero el sitio web puede darle el precio si no utiliz ningn seguro.  - Puede imprimir el cupn correspondiente y llevarlo con su receta a la farmacia.  - Tambin puede pasar por nuestra oficina durante el horario de atencin regular y recoger una tarjeta de cupones de GoodRx.  - Si necesita que su receta se enve electrnicamente a una farmacia diferente, informe a nuestra oficina a travs de MyChart de  o por telfono llamando al [phone] y presione la opcin 4.  

## 2023-04-16 ENCOUNTER — Other Ambulatory Visit: Payer: Self-pay

## 2023-04-16 ENCOUNTER — Encounter (HOSPITAL_COMMUNITY): Payer: Self-pay

## 2023-04-16 ENCOUNTER — Emergency Department (HOSPITAL_COMMUNITY): Payer: Medicare Other

## 2023-04-16 ENCOUNTER — Emergency Department (HOSPITAL_COMMUNITY)
Admission: EM | Admit: 2023-04-16 | Discharge: 2023-04-16 | Disposition: A | Discharge status: Home / Self Care | Payer: Medicare Other | Attending: Emergency Medicine | Admitting: Emergency Medicine

## 2023-04-16 DIAGNOSIS — W19XXXA Unspecified fall, initial encounter: Secondary | ICD-10-CM

## 2023-04-16 DIAGNOSIS — M25562 Pain in left knee: Secondary | ICD-10-CM | POA: Diagnosis not present

## 2023-04-16 DIAGNOSIS — W1811XA Fall from or off toilet without subsequent striking against object, initial encounter: Secondary | ICD-10-CM | POA: Diagnosis not present

## 2023-04-16 DIAGNOSIS — M25552 Pain in left hip: Secondary | ICD-10-CM | POA: Diagnosis present

## 2023-04-16 MED ORDER — METHOCARBAMOL 500 MG PO TABS: 500.0000 mg | ORAL_TABLET | Freq: Two times a day (BID) | ORAL | 0 refills | Status: AC

## 2023-04-16 MED ORDER — IBUPROFEN 600 MG PO TABS: 600.0000 mg | ORAL_TABLET | Freq: Four times a day (QID) | ORAL | 0 refills | Status: AC | PRN

## 2023-04-16 MED ORDER — ACETAMINOPHEN 325 MG PO TABS: 650.0000 mg | ORAL_TABLET | Freq: Four times a day (QID) | ORAL | 0 refills | Status: AC | PRN

## 2023-04-16 MED ORDER — OXYCODONE-ACETAMINOPHEN 5-325 MG PO TABS
1.0000 | ORAL_TABLET | Freq: Once | ORAL | Status: AC
  Administered 2023-04-16: 1 via ORAL
  Filled 2023-04-16: qty 1

## 2023-04-16 NOTE — ED Triage Notes (Signed)
Pt fell yesterday at home onto the left knee. Was doing ok after ward but now he hurts in the left hip. Unable to bear weight now.

## 2023-04-16 NOTE — ED Provider Notes (Signed)
[place] EMERGENCY DEPARTMENT AT [facility] Provider Note  CSN: [id number] Arrival date & time: 04/16/23 0408  Chief Complaint(s) Hip Pain  HPI Kevin Valentine is a 82 y.o. male with past medical history as below, significant for corticobasal syndrome, tremor who presents to the ED with complaint of fall, left hip pain.  Patient a fall yesterday, slipped on wet floor in the bathroom, fell to his left knee and hit his left hip.  No head injury or LOC.  No blood thinners.  Reports initially had some discomfort to his left knee which is since resolved.  He began having left hip pain this evening and unable to put weight on his left hip secondary to the discomfort.  No numbness or tingling seems new.  No low back pain, no change in bowel or bladder function, no fevers or chills. no Abdominal pain.  No medication prior to arrival.  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 Authorizing Provider  acetaminophen (TYLENOL) 325 MG tablet Take 2 tablets (650 mg total) by mouth every 6 (six) hours as needed. 04/16/23  Yes Tanda Rockers A, DO  ibuprofen (ADVIL) 600 MG tablet Take 1 tablet (600 mg total) by mouth every 6 (six) hours as needed. 04/16/23  Yes Sloan Leiter, DO  methocarbamol (ROBAXIN) 500 MG tablet Take 1 tablet (500 mg total) by mouth 2 (two) times daily for 5 days. 04/16/23 04/21/23 Yes Sloan Leiter, DO  allopurinol (ZYLOPRIM) 300 MG tablet Take 300 mg by mouth daily.    [provider]  amLODipine (NORVASC) 5 MG tablet Take by mouth. 10/10/22   [provider]  aspirin EC 81 MG tablet Take 81 mg by mouth daily.    [provider]  atenolol (TENORMIN) 50 MG tablet  Take 1 tablet by mouth daily. 10/24/22 10/24/23  [provider]  carbidopa-levodopa (SINEMET IR) 25-100 MG tablet Take by mouth. 06/09/22   [provider]  colchicine 0.6 MG tablet One po bid x3-4 days for an acute gout flare 10/29/18   [provider]  traZODone (DESYREL) 100 MG tablet Take 100 mg by mouth at bedtime.    [provider]                                                                                                                                    Past Surgical History Past Surgical History:  Procedure Laterality Date   ANKLE FUSION Right    CHOLECYSTECTOMY N/A 06/26/2017   Procedure: LAPAROSCOPIC CHOLECYSTECTOMY;  Surgeon: Ricarda Frame, MD;  Location: [facility] ORS;  Service: General;  Laterality: N/A;   HERNIA  REPAIR     Family History History reviewed. No pertinent family history.  Social History Social History   Tobacco Use   Smoking status: Never   Smokeless tobacco: Never  Substance Use Topics   Alcohol use: No   Drug use: No   Allergies Patient has no known allergies.  Review of Systems Review of Systems  Constitutional:  Negative for chills and fever.  HENT:  Negative for facial swelling and trouble swallowing.   Eyes:  Negative for photophobia and visual disturbance.  Respiratory:  Negative for cough and shortness of breath.   Cardiovascular:  Negative for chest pain and palpitations.  Gastrointestinal:  Negative for abdominal pain, nausea and vomiting.  Endocrine: Negative for polydipsia and polyuria.  Genitourinary:  Negative for difficulty urinating and hematuria.  Musculoskeletal:  Positive for arthralgias. Negative for gait problem and joint swelling.  Skin:  Negative for pallor and rash.  Neurological:  Negative for syncope and headaches.  Psychiatric/Behavioral:  Negative for agitation and confusion.     Physical Exam Vital Signs  I have reviewed the triage vital signs BP (!) 154/84   Pulse 66    Temp 98 F (36.7 C)   Resp 16   Ht 6' (1.829 m)   Wt 101.6 kg   SpO2 98%   BMI 30.38 kg/m  Physical Exam Vitals and nursing note reviewed.  Constitutional:      General: He is not in acute distress.    Appearance: He is well-developed.  HENT:     Head: Normocephalic and atraumatic.     Right Ear: External ear normal.     Left Ear: External ear normal.     Mouth/Throat:     Mouth: Mucous membranes are moist.  Eyes:     General: No scleral icterus. Cardiovascular:     Rate and Rhythm: Normal rate and regular rhythm.     Pulses: Normal pulses.     Heart sounds: Normal heart sounds.  Pulmonary:     Effort: Pulmonary effort is normal. No respiratory distress.     Breath sounds: Normal breath sounds.  Abdominal:     General: Abdomen is flat.     Palpations: Abdomen is soft.     Tenderness: There is no abdominal tenderness.  Musculoskeletal:        General: Normal range of motion.     Right lower leg: No edema.     Left lower leg: No edema.       Legs:     Comments: Mild TTP at lateral aspect of acetabulum. Lower extremities NVI Full range of motion to bilateral lower extremities pelvis stable AP pressure, no significant pain with logroll b/l le   Skin:    General: Skin is warm and dry.     Capillary Refill: Capillary refill takes less than 2 seconds.  Neurological:     Mental Status: He is alert and oriented to person, place, and time. Mental status is at baseline.     GCS: GCS eye subscore is 4. GCS verbal subscore is 5. GCS motor subscore is 6.     Motor: Tremor present.  Psychiatric:        Mood and Affect: Mood normal.        Behavior: Behavior normal.     ED Results and Treatments Labs (all labs ordered are listed, but only abnormal results are displayed) Labs Reviewed - No data to display  Radiology DG Hip Unilat W or Wo Pelvis 2-3 Views  Left  Result Date: 04/16/2023 CLINICAL DATA:  82 year old male status post fall. EXAM: DG HIP (WITH OR WITHOUT PELVIS) 2-3V LEFT COMPARISON:  None Available. FINDINGS: Bone mineralization is within normal limits for age. There is no evidence of hip fracture or dislocation. Incidental left pelvic phleboliths. Joint spaces appear symmetric. Negative bowel gas pattern. IMPRESSION: No acute fracture or dislocation identified about the left hip or pelvis. Electronically Signed   By: Odessa Fleming M.D.   On: 04/16/2023 05:52    Pertinent labs & imaging results that were available during my care of the patient were reviewed by me and considered in my medical decision making (see MDM for details).  Medications Ordered in ED Medications  oxyCODONE-acetaminophen (PERCOCET/ROXICET) 5-325 MG per tablet 1 tablet (1 tablet Oral Given 04/16/23 0450)                                                                                                                                     Procedures Procedures  (including critical care time)  Medical Decision Making / ED Course    Medical Decision Making:    TAEVION HOLTORF is a 82 y.o. male  with past medical history as below, significant for corticobasal syndrome, tremor who presents to the ED with complaint of fall, left hip pain.. The complaint involves an extensive differential diagnosis and also carries with it a high risk of complications and morbidity.  Serious etiology was considered. Ddx includes but is not limited to: Sprain, strain, soft tissue injury, fracture, ligamentous injury, bursitis etc.   Complete initial physical exam performed, notably the patient  was no acute distress, resting comfortably, LE NVI.    Reviewed and confirmed nursing documentation for past medical history, family history, social history.  Vital signs reviewed.    Clinical Course as of 04/16/23 4098  Wynelle Link Apr 16, 2023  1191 Hip xr wnl, pt able to ambulate at his typical level per  family at bedside. Pain greatly improved after analgesic. [SG]    Clinical Course User Index [SG] Sloan Leiter, DO    Exam is reassuring, full range of motion left lower extremity and NVI.  Pulses equal bilateral.  X-ray reviewed and is stable.  Pain significant proved after oral analgesics.  Ambulatory typical gait.  Feeling better overall, stable for discharge, suspicion for fracture at this time given negative x-ray and ability to bear weight without difficulty, steady gait.  The patient improved significantly and was discharged in stable condition. Detailed discussions were had with the patient regarding current findings, and need for close f/u with PCP or on call doctor. The patient has been instructed to return immediately if the symptoms worsen in any way for re-evaluation. Patient verbalized understanding and is in agreement with current care plan. All questions answered prior to discharge.         Additional  history obtained: -Additional history obtained from family -External records from outside source obtained and reviewed including: Chart review including previous notes, labs, imaging, consultation notes including primary care documentation, prior labs and imaging, home medications   Lab Tests: na  EKG   EKG Interpretation  Date/Time:    Ventricular Rate:    PR Interval:    QRS Duration:   QT Interval:    QTC Calculation:   R Axis:     Text Interpretation:           Imaging Studies ordered: I ordered imaging studies including hip x-ray left I independently visualized the following imaging with scope of interpretation limited to determining acute life threatening conditions related to emergency care; findings noted above, significant for no fracture I independently visualized and interpreted imaging. I agree with the radiologist interpretation   Medicines ordered and prescription drug management: Meds ordered this encounter  Medications    oxyCODONE-acetaminophen (PERCOCET/ROXICET) 5-325 MG per tablet 1 tablet   ibuprofen (ADVIL) 600 MG tablet    Sig: Take 1 tablet (600 mg total) by mouth every 6 (six) hours as needed.    Dispense:  30 tablet    Refill:  0   acetaminophen (TYLENOL) 325 MG tablet    Sig: Take 2 tablets (650 mg total) by mouth every 6 (six) hours as needed.    Dispense:  36 tablet    Refill:  0   methocarbamol (ROBAXIN) 500 MG tablet    Sig: Take 1 tablet (500 mg total) by mouth 2 (two) times daily for 5 days.    Dispense:  10 tablet    Refill:  0    -I have reviewed the patients home medicines and have made adjustments as needed   Consultations Obtained: na   Cardiac Monitoring: na  Social Determinants of Health:  Diagnosis or treatment significantly limited by social determinants of health: obesity   Reevaluation: After the interventions noted above, I reevaluated the patient and found that they have improved  Co morbidities that complicate the patient evaluation  Past Medical History:  Diagnosis Date   Corticobasal syndrome (HCC)       Dispostion: Disposition decision including need for hospitalization was considered, and patient discharged from emergency department.    Final Clinical Impression(s) / ED Diagnoses Final diagnoses:  Acute pain of left hip  Fall, initial encounter     This chart was dictated using voice recognition software.  Despite best efforts to proofread,  errors can occur which can change the documentation meaning.    Sloan Leiter, DO 04/16/23 [phone]

## 2023-04-16 NOTE — Discharge Instructions (Addendum)
It was a pleasure caring for you today in the emergency department. ° °Please return to the emergency department for any worsening or worrisome symptoms. ° ° °

## 2023-04-16 NOTE — ED Notes (Signed)
Pt ambulated to the side of the nurses desk and back, able to bear weight on that left leg.

## 2023-10-30 ENCOUNTER — Ambulatory Visit: Payer: Medicare Other | Admitting: Dermatology

## 2024-05-28 ENCOUNTER — Emergency Department

## 2024-05-28 ENCOUNTER — Emergency Department
Admission: EM | Admit: 2024-05-28 | Discharge: 2024-05-28 | Disposition: A | Discharge status: Home / Self Care | Attending: Emergency Medicine | Admitting: Emergency Medicine

## 2024-05-28 ENCOUNTER — Other Ambulatory Visit: Payer: Self-pay

## 2024-05-28 DIAGNOSIS — E278 Other specified disorders of adrenal gland: Secondary | ICD-10-CM | POA: Insufficient documentation

## 2024-05-28 DIAGNOSIS — G20C Parkinsonism, unspecified: Secondary | ICD-10-CM | POA: Insufficient documentation

## 2024-05-28 DIAGNOSIS — N400 Enlarged prostate without lower urinary tract symptoms: Secondary | ICD-10-CM | POA: Diagnosis not present

## 2024-05-28 DIAGNOSIS — R112 Nausea with vomiting, unspecified: Secondary | ICD-10-CM | POA: Insufficient documentation

## 2024-05-28 DIAGNOSIS — E279 Disorder of adrenal gland, unspecified: Secondary | ICD-10-CM

## 2024-05-28 LAB — CBC
HCT: 45.6 % (ref 39.0–52.0)
Hemoglobin: 14.9 g/dL (ref 13.0–17.0)
MCH: 30.7 pg (ref 26.0–34.0)
MCHC: 32.7 g/dL (ref 30.0–36.0)
MCV: 93.8 fL (ref 80.0–100.0)
Platelets: 246 10*3/uL (ref 150–400)
RBC: 4.86 MIL/uL (ref 4.22–5.81)
RDW: 13.8 % (ref 11.5–15.5)
WBC: 11.2 10*3/uL — ABNORMAL HIGH (ref 4.0–10.5)
nRBC: 0 % (ref 0.0–0.2)

## 2024-05-28 LAB — URINALYSIS, ROUTINE W REFLEX MICROSCOPIC
Bilirubin Urine: NEGATIVE
Glucose, UA: NEGATIVE mg/dL
Hgb urine dipstick: NEGATIVE
Ketones, ur: 20 mg/dL — AB
Leukocytes,Ua: NEGATIVE
Nitrite: NEGATIVE
Protein, ur: NEGATIVE mg/dL
Specific Gravity, Urine: 1.023 (ref 1.005–1.030)
pH: 6 (ref 5.0–8.0)

## 2024-05-28 LAB — COMPREHENSIVE METABOLIC PANEL WITH GFR
ALT: 5 U/L (ref 0–44)
AST: 24 U/L (ref 15–41)
Albumin: 4.2 g/dL (ref 3.5–5.0)
Alkaline Phosphatase: 67 U/L (ref 38–126)
Anion gap: 11 (ref 5–15)
BUN: 17 mg/dL (ref 8–23)
CO2: 23 mmol/L (ref 22–32)
Calcium: 9.2 mg/dL (ref 8.9–10.3)
Chloride: 103 mmol/L (ref 98–111)
Creatinine, Ser: 1.03 mg/dL (ref 0.61–1.24)
GFR, Estimated: >60 mL/min (ref >60)
Glucose, Bld: 130 mg/dL — ABNORMAL HIGH (ref 70–99)
Potassium: 3.4 mmol/L — ABNORMAL LOW (ref 3.5–5.1)
Sodium: 137 mmol/L (ref 135–145)
Total Bilirubin: 0.9 mg/dL (ref 0.0–1.2)
Total Protein: 7.4 g/dL (ref 6.5–8.1)

## 2024-05-28 LAB — LIPASE, BLOOD: Lipase: 42 U/L (ref 11–51)

## 2024-05-28 MED ORDER — SODIUM CHLORIDE 0.9 % IV BOLUS
1000.0000 mL | Freq: Once | INTRAVENOUS | Status: AC
  Administered 2024-05-28: 1000 mL via INTRAVENOUS

## 2024-05-28 MED ORDER — ONDANSETRON 4 MG PO TBDP
ORAL_TABLET | ORAL | Status: AC
  Filled 2024-05-28: qty 1

## 2024-05-28 MED ORDER — ONDANSETRON 4 MG PO TBDP
4.0000 mg | ORAL_TABLET | Freq: Once | ORAL | Status: AC
  Administered 2024-05-28: 4 mg via ORAL

## 2024-05-28 MED ORDER — ONDANSETRON 4 MG PO TBDP: 4.0000 mg | ORAL_TABLET | Freq: Four times a day (QID) | ORAL | 0 refills | Status: AC | PRN

## 2024-05-28 MED ORDER — IOHEXOL 300 MG/ML  SOLN
100.0000 mL | Freq: Once | INTRAMUSCULAR | Status: AC | PRN
  Administered 2024-05-28: 100 mL via INTRAVENOUS

## 2024-05-28 MED ORDER — ONDANSETRON HCL 4 MG/2ML IJ SOLN
4.0000 mg | INTRAMUSCULAR | Status: AC
  Administered 2024-05-28: 4 mg via INTRAVENOUS
  Filled 2024-05-28: qty 2

## 2024-05-28 NOTE — ED Notes (Signed)
 Patient transported to CT

## 2024-05-28 NOTE — ED Provider Notes (Signed)
 [facility] Provider Note    Event Date/Time   First MD Initiated Contact with Patient 05/28/24 1721     (approximate)   History   Emesis and Dizziness   HPI  Kevin Valentine is a 83 y.o. male   who has a history of Parkinson's.  He is here with his family including son and grandson  He was in his normal health, they report for a long time now or months now he has been very quiet calm not very conversant as part of his Parkinson's.  He was normal but he ate lunch and after eating lunch he suddenly started vomiting.  He vomited several times for about the course of 1 to 2 hours.  Has not had any further vomiting but patient reports he feels mild nausea but no longer feels like he needs to vomit  Has been no headache no chest pain no falls or injuries.  It came upon very suddenly.  No further vomiting at this point.  No bloody vomit no dark or black stool no diarrhea  Not noticed any fevers and he has no abdominal pain or chest pain no trouble breathing      Physical Exam   Triage Vital Signs: ED Triage Vitals [05/28/24 1627]  Encounter Vitals Group     BP (!) 154/86     Girls Systolic BP Percentile      Girls Diastolic BP Percentile      Boys Systolic BP Percentile      Boys Diastolic BP Percentile      Pulse Rate (!) 55     Resp 20     Temp 97.8 F (36.6 C)     Temp Source Oral     SpO2 96 %     Weight      Height      Head Circumference      Peak Flow      Pain Score 3     Pain Loc      Pain Education      Exclude from Growth Chart     Most recent vital signs: Vitals:   05/28/24 2130 05/28/24 2200  BP: (!) 149/84   Pulse: 63   Resp: 18   Temp:  (!) 97 F (36.1 C)  SpO2: 96%      General: Awake, no distress.  Slow with his cognition, son reports this has been ongoing related to his Parkinson's.  He recognizes signs and is oriented to situation.  Some parkinsonian-like tremor CV:  Good peripheral perfusion.  Normal tones and  rate Resp:  Normal effort.  Clear bilateral Abd:  No distention.  Abdomen soft nontender nondistended.  He reports mild feeling of nausea.  No hernias or masses noted.  Negative Abigail Abler, family reports previous gallbladder removal Other:  Warm well-perfused extremities   ED Results / Procedures / Treatments   Labs (all labs ordered are listed, but only abnormal results are displayed) Labs Reviewed  COMPREHENSIVE METABOLIC PANEL WITH GFR - Abnormal; Notable for the following components:      Result Value   Potassium 3.4 (*)    Glucose, Bld 130 (*)    All other components within normal limits  CBC - Abnormal; Notable for the following components:   WBC 11.2 (*)    All other components within normal limits  URINALYSIS, ROUTINE W REFLEX MICROSCOPIC - Abnormal; Notable for the following components:   Color, Urine YELLOW (*)    APPearance CLEAR (*)  Ketones, ur 20 (*)    All other components within normal limits  LIPASE, BLOOD  CBG MONITORING, ED     EKG  ED interpreted by me at 1630 heart rate 55 QRS 80 QTc 400 Artifact, this appears likely a normal sinus rhythm with baseline artifact.  Will repeat. No obvious ischemia     RADIOLOGY   Given the patient's acute nausea and vomiting CT head performed to evaluate  Acute gross though felt unlikely cause such as intracranial hemorrhage   CT ABDOMEN PELVIS W CONTRAST Result Date: 05/28/2024 CLINICAL DATA:  Nausea vomiting dizzy EXAM: CT ABDOMEN AND PELVIS WITH CONTRAST TECHNIQUE: Multidetector CT imaging of the abdomen and pelvis was performed using the standard protocol following bolus administration of intravenous contrast. RADIATION DOSE REDUCTION: This exam was performed according to the departmental dose-optimization program which includes automated exposure control, adjustment of the mA and/or kV according to patient size and/or use of iterative reconstruction technique. CONTRAST:  100mL OMNIPAQUE IOHEXOL 300 MG/ML  SOLN  COMPARISON:  None Available. FINDINGS: Lower chest: Lung bases demonstrate no acute airspace disease. Mild bronchiectasis and scarring in the lower lobes. Hepatobiliary: No focal liver abnormality is seen. Status post cholecystectomy. No biliary dilatation. Pancreas: Unremarkable. No pancreatic ductal dilatation or surrounding inflammatory changes. Spleen: Normal in size without focal abnormality. Adrenals/Urinary Tract: Right adrenal gland is normal. 14 mm left adrenal nodule with density value of 31.8. Right kidney shows no hydronephrosis. Mild hydronephrosis of the upper pole of the left kidney. 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 Urinary bladder is unremarkable Stomach/Bowel: Stomach nonenlarged. No dilated small bowel. Negative appendix. No acute bowel wall thickening. Moderate stool in the colon. Vascular/Lymphatic: Aortic atherosclerosis. No enlarged abdominal or pelvic lymph nodes. Reproductive: Enlarged prostate Other: Negative for pelvic effusion or free air. Small fat containing left inguinal hernia Musculoskeletal: No acute or suspicious osseous abnormality. IMPRESSION: 1. No CT evidence for acute intra-abdominal or pelvic abnormality. 2. Mild hydronephrosis of the upper pole of the left kidney, question related to possible mass effect from prominent renal sinus cyst. 3. Enlarged prostate. 4. 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 Recommend follow-up adrenal washout CT in 1 year. If stable for greater than or equal to 1 year, no further follow-up imaging. 5. Aortic atherosclerosis. Aortic Atherosclerosis (ICD10-I70.0). Electronically Signed   By: Esmeralda Hedge M.D.   On: 05/28/2024 18:53   CT Head Wo Contrast Result Date: 05/28/2024 CLINICAL DATA:  Head trauma vomiting EXAM: CT HEAD WITHOUT CONTRAST TECHNIQUE: Contiguous axial images were obtained from the base of the skull through the vertex without intravenous contrast.  RADIATION DOSE REDUCTION: This exam was performed according to the departmental dose-optimization program which includes automated exposure control, adjustment of the mA and/or kV according to patient size and/or use of iterative reconstruction technique. COMPARISON:  MRI 05/25/2021 FINDINGS: Brain: No acute territorial infarction, hemorrhage or intracranial mass. Moderate atrophy. Mild chronic small vessel ischemic changes of the white matter. Stable ventricle size. Vascular: No hyperdense vessels.  No unexpected calcification Skull: Normal. Negative for fracture or focal lesion. Sinuses/Orbits: No acute finding. Other: None IMPRESSION: 1. No CT evidence for acute intracranial abnormality. 2. Atrophy and chronic small vessel ischemic changes of the white matter. Electronically Signed   By: Esmeralda Hedge M.D.   On: 05/28/2024 18:00       PROCEDURES:  Critical Care performed: No  Procedures   MEDICATIONS ORDERED IN ED: Medications  ondansetron  (ZOFRAN ) injection  4 mg (4 mg Intravenous Given 05/28/24 1820)  sodium chloride  0.9 % bolus 1,000 mL (0 mLs Intravenous Stopped 05/28/24 2008)  iohexol (OMNIPAQUE) 300 MG/ML solution 100 mL (100 mLs Intravenous Contrast Given 05/28/24 1839)  ondansetron  (ZOFRAN -ODT) disintegrating tablet 4 mg ( Oral Canceled Entry 05/28/24 2209)     IMPRESSION / MDM / ASSESSMENT AND PLAN / ED COURSE  I reviewed the triage vital signs and the nursing notes.                              Differential diagnosis includes, but is not limited to, gastritis, obstruction, medication effect, dehydration electrolyte abnormality food poisoning self-limited gastrointestinal illness, pancreatitis, etc.  Patient at this point relates ongoing mild nausea but no further vomiting.  Had acute onset multiple episode of vomiting that now seems to have resolved with no report of any bloody or emesis fever or obvious infectious symptoms.  No associated acute neurologic vascular cardiac or  pulmonary symptoms.    Patient's presentation is most consistent with acute complicated illness / injury requiring diagnostic workup.    ----------------------------------------- 10:38 PM on 05/28/2024 ----------------------------------------- Workup very reassuring.  Patient able to take food and water without difficulty nausea has subsided completely.  He and family agreeable with plan for careful return precautions and treatment with  Incidental CT findings including prostate enlargement and adrenal nodule discussed with patient as well as his son, they will follow-up with his primary care regarding these issues   Zofran .  At this point I suspect likely acute self-limited gastrointestinal causation.  Return precautions and treatment recommendations and follow-up discussed with the patient who is agreeable with the plan.       FINAL CLINICAL IMPRESSION(S) / ED DIAGNOSES   Final diagnoses:  Adrenal nodule (HCC)  Enlarged prostate  Nausea and vomiting, unspecified vomiting type     Rx / DC Orders   ED Discharge Orders          Ordered    Ambulatory referral to Urology        05/28/24 2128    ondansetron  (ZOFRAN -ODT) 4 MG disintegrating tablet  Every 6 hours PRN        05/28/24 2238             Note:  This document was prepared using Dragon voice recognition software and may include unintentional dictation errors.   Iver Marker, MD 05/28/24 2239

## 2024-05-28 NOTE — Discharge Instructions (Addendum)
? ?  Please return to the emergency room right away if you are to develop a fever, severe nausea, your pain becomes severe or worsens, you are unable to keep food down, begin vomiting any dark or bloody fluid, you develop any dark or bloody stools, feel dehydrated, or other new concerns or symptoms arise. ? ?

## 2024-05-28 NOTE — ED Triage Notes (Signed)
 Pt to ED via POV from home with family. Family reports pt reported N/V and dizziness that started this am. Pt was able to eat lunch and then started having the N/V and dizziness again.

## 2024-05-28 NOTE — ED Notes (Signed)
 Pt up to bedside commode w/ 2 stand-by assist. Pt denies dizziness. Pt urinated then assisted back to bed. PO challenge passed. No evidence of emesis. Family at bedside

## 2024-05-28 NOTE — ED Notes (Signed)
Pt given gingerale and crackers for po challenge

## 2024-06-05 ENCOUNTER — Emergency Department (HOSPITAL_COMMUNITY)

## 2024-06-05 ENCOUNTER — Encounter (HOSPITAL_COMMUNITY): Payer: Self-pay

## 2024-06-05 ENCOUNTER — Inpatient Hospital Stay (HOSPITAL_COMMUNITY)
Admission: EM | Admit: 2024-06-05 | Discharge: 2024-06-10 | DRG: 536 | Disposition: A | Discharge status: Skilled Nursing Facility | Attending: Family Medicine | Admitting: Family Medicine

## 2024-06-05 ENCOUNTER — Other Ambulatory Visit: Payer: Self-pay

## 2024-06-05 DIAGNOSIS — S7012XA Contusion of left thigh, initial encounter: Secondary | ICD-10-CM | POA: Diagnosis present

## 2024-06-05 DIAGNOSIS — S72009A Fracture of unspecified part of neck of unspecified femur, initial encounter for closed fracture: Secondary | ICD-10-CM | POA: Diagnosis present

## 2024-06-05 DIAGNOSIS — Z711 Person with feared health complaint in whom no diagnosis is made: Secondary | ICD-10-CM

## 2024-06-05 DIAGNOSIS — Z515 Encounter for palliative care: Secondary | ICD-10-CM | POA: Diagnosis not present

## 2024-06-05 DIAGNOSIS — R404 Transient alteration of awareness: Secondary | ICD-10-CM | POA: Diagnosis not present

## 2024-06-05 DIAGNOSIS — W19XXXA Unspecified fall, initial encounter: Secondary | ICD-10-CM | POA: Diagnosis present

## 2024-06-05 DIAGNOSIS — G20A1 Parkinson's disease without dyskinesia, without mention of fluctuations: Secondary | ICD-10-CM | POA: Diagnosis present

## 2024-06-05 DIAGNOSIS — Z79899 Other long term (current) drug therapy: Secondary | ICD-10-CM

## 2024-06-05 DIAGNOSIS — G4700 Insomnia, unspecified: Secondary | ICD-10-CM | POA: Diagnosis present

## 2024-06-05 DIAGNOSIS — F02818 Dementia in other diseases classified elsewhere, unspecified severity, with other behavioral disturbance: Secondary | ICD-10-CM | POA: Diagnosis present

## 2024-06-05 DIAGNOSIS — R296 Repeated falls: Secondary | ICD-10-CM | POA: Diagnosis present

## 2024-06-05 DIAGNOSIS — Z66 Do not resuscitate: Secondary | ICD-10-CM | POA: Diagnosis not present

## 2024-06-05 DIAGNOSIS — Z981 Arthrodesis status: Secondary | ICD-10-CM

## 2024-06-05 DIAGNOSIS — Z7401 Bed confinement status: Secondary | ICD-10-CM | POA: Diagnosis not present

## 2024-06-05 DIAGNOSIS — Z6831 Body mass index (BMI) 31.0-31.9, adult: Secondary | ICD-10-CM

## 2024-06-05 DIAGNOSIS — S32402A Unspecified fracture of left acetabulum, initial encounter for closed fracture: Secondary | ICD-10-CM | POA: Diagnosis not present

## 2024-06-05 DIAGNOSIS — Y92009 Unspecified place in unspecified non-institutional (private) residence as the place of occurrence of the external cause: Secondary | ICD-10-CM | POA: Diagnosis not present

## 2024-06-05 DIAGNOSIS — E66811 Obesity, class 1: Secondary | ICD-10-CM | POA: Diagnosis present

## 2024-06-05 DIAGNOSIS — I10 Essential (primary) hypertension: Secondary | ICD-10-CM | POA: Diagnosis present

## 2024-06-05 DIAGNOSIS — Z7982 Long term (current) use of aspirin: Secondary | ICD-10-CM | POA: Diagnosis not present

## 2024-06-05 DIAGNOSIS — Z7189 Other specified counseling: Secondary | ICD-10-CM | POA: Diagnosis not present

## 2024-06-05 LAB — CBC WITH DIFFERENTIAL/PLATELET
Abs Immature Granulocytes: 0.12 10*3/uL — ABNORMAL HIGH (ref 0.00–0.07)
Basophils Absolute: 0.1 10*3/uL (ref 0.0–0.1)
Basophils Relative: 0 %
Eosinophils Absolute: 0.1 10*3/uL (ref 0.0–0.5)
Eosinophils Relative: 0 %
HCT: 44.3 % (ref 39.0–52.0)
Hemoglobin: 14.8 g/dL (ref 13.0–17.0)
Immature Granulocytes: 1 %
Lymphocytes Relative: 8 %
Lymphs Abs: 1.2 10*3/uL (ref 0.7–4.0)
MCH: 31.8 pg (ref 26.0–34.0)
MCHC: 33.4 g/dL (ref 30.0–36.0)
MCV: 95.1 fL (ref 80.0–100.0)
Monocytes Absolute: 0.9 10*3/uL (ref 0.1–1.0)
Monocytes Relative: 6 %
Neutro Abs: 12.3 10*3/uL — ABNORMAL HIGH (ref 1.7–7.7)
Neutrophils Relative %: 85 %
Platelets: 191 10*3/uL (ref 150–400)
RBC: 4.66 MIL/uL (ref 4.22–5.81)
RDW: 13.9 % (ref 11.5–15.5)
WBC: 14.6 10*3/uL — ABNORMAL HIGH (ref 4.0–10.5)
nRBC: 0 % (ref 0.0–0.2)

## 2024-06-05 LAB — BASIC METABOLIC PANEL WITH GFR
Anion gap: 9 (ref 5–15)
BUN: 15 mg/dL (ref 8–23)
CO2: 25 mmol/L (ref 22–32)
Calcium: 9 mg/dL (ref 8.9–10.3)
Chloride: 104 mmol/L (ref 98–111)
Creatinine, Ser: 1.17 mg/dL (ref 0.61–1.24)
GFR, Estimated: >60 mL/min (ref >60)
Glucose, Bld: 117 mg/dL — ABNORMAL HIGH (ref 70–99)
Potassium: 3.9 mmol/L (ref 3.5–5.1)
Sodium: 138 mmol/L (ref 135–145)

## 2024-06-05 LAB — HEMOGLOBIN A1C
Hgb A1c MFr Bld: 5.2 % (ref 4.8–5.6)
Mean Plasma Glucose: 102.54 mg/dL

## 2024-06-05 MED ORDER — ACETAMINOPHEN 325 MG PO TABS
650.0000 mg | ORAL_TABLET | ORAL | Status: DC | PRN
  Administered 2024-06-06 – 2024-06-10 (×4): 650 mg via ORAL
  Filled 2024-06-05 (×4): qty 2

## 2024-06-05 MED ORDER — ENOXAPARIN SODIUM 40 MG/0.4ML IJ SOSY
40.0000 mg | PREFILLED_SYRINGE | INTRAMUSCULAR | Status: DC
  Administered 2024-06-05 – 2024-06-09 (×5): 40 mg via SUBCUTANEOUS
  Filled 2024-06-05 (×5): qty 0.4

## 2024-06-05 MED ORDER — ATENOLOL 50 MG PO TABS
50.0000 mg | ORAL_TABLET | Freq: Every day | ORAL | Status: DC
  Administered 2024-06-06 – 2024-06-10 (×5): 50 mg via ORAL
  Filled 2024-06-05: qty 1
  Filled 2024-06-05: qty 2
  Filled 2024-06-05 (×3): qty 1

## 2024-06-05 MED ORDER — STROKE: EARLY STAGES OF RECOVERY BOOK: Freq: Once | Status: AC

## 2024-06-05 MED ORDER — IOHEXOL 350 MG/ML SOLN
75.0000 mL | Freq: Once | INTRAVENOUS | Status: AC | PRN
  Administered 2024-06-05: 75 mL via INTRAVENOUS

## 2024-06-05 MED ORDER — ASPIRIN 81 MG PO TBEC
81.0000 mg | DELAYED_RELEASE_TABLET | Freq: Every day | ORAL | Status: DC
  Administered 2024-06-06 – 2024-06-10 (×5): 81 mg via ORAL
  Filled 2024-06-05 (×7): qty 1

## 2024-06-05 MED ORDER — MORPHINE SULFATE (PF) 4 MG/ML IV SOLN
4.0000 mg | Freq: Once | INTRAVENOUS | Status: AC
  Administered 2024-06-05: 4 mg via INTRAVENOUS
  Filled 2024-06-05: qty 1

## 2024-06-05 MED ORDER — CARBIDOPA-LEVODOPA 25-100 MG PO TABS
1.0000 | ORAL_TABLET | Freq: Three times a day (TID) | ORAL | Status: DC
  Administered 2024-06-06 (×2): 1 via ORAL
  Filled 2024-06-05 (×3): qty 1

## 2024-06-05 MED ORDER — LORAZEPAM 2 MG/ML PO CONC: 2.0000 mg | Freq: Once | ORAL | Status: DC

## 2024-06-05 MED ORDER — AMLODIPINE BESYLATE 5 MG PO TABS
5.0000 mg | ORAL_TABLET | Freq: Every day | ORAL | Status: DC
  Administered 2024-06-06 – 2024-06-10 (×5): 5 mg via ORAL
  Filled 2024-06-05 (×5): qty 1

## 2024-06-05 MED ORDER — ACETAMINOPHEN 650 MG RE SUPP: 650.0000 mg | RECTAL | Status: DC | PRN

## 2024-06-05 MED ORDER — ALLOPURINOL 300 MG PO TABS
300.0000 mg | ORAL_TABLET | Freq: Every day | ORAL | Status: DC
  Administered 2024-06-06 – 2024-06-10 (×5): 300 mg via ORAL
  Filled 2024-06-05: qty 3
  Filled 2024-06-05: qty 1
  Filled 2024-06-05: qty 3
  Filled 2024-06-05 (×2): qty 1
  Filled 2024-06-05: qty 3
  Filled 2024-06-05: qty 1

## 2024-06-05 MED ORDER — SODIUM CHLORIDE (PF) 0.9 % IJ SOLN
INTRAMUSCULAR | Status: AC
  Filled 2024-06-05: qty 50

## 2024-06-05 MED ORDER — ACETAMINOPHEN 160 MG/5ML PO SOLN: 650.0000 mg | ORAL | Status: DC | PRN

## 2024-06-05 MED ORDER — LORAZEPAM 2 MG/ML IJ SOLN
2.0000 mg | Freq: Once | INTRAMUSCULAR | Status: AC
  Administered 2024-06-05: 2 mg via INTRAVENOUS
  Filled 2024-06-05: qty 1

## 2024-06-05 MED ORDER — TRAZODONE HCL 50 MG PO TABS
100.0000 mg | ORAL_TABLET | Freq: Every day | ORAL | Status: DC
  Administered 2024-06-06 – 2024-06-09 (×4): 100 mg via ORAL
  Filled 2024-06-05: qty 1
  Filled 2024-06-05 (×4): qty 2

## 2024-06-05 MED ORDER — SODIUM CHLORIDE 0.9 % IV SOLN: INTRAVENOUS | Status: DC

## 2024-06-05 NOTE — ED Triage Notes (Signed)
 Pt BIB EMS from Home due to a witnessed mechanical fall and fell on buttocks; pt tripped when walking. No LOC, no blood thinners, family reports pt did not hit his head. No visible injuries. Pt lives with family at home. Pt c/o left leg pain. Pt left eye more dilated than right eye. Hx of Dementia and Parkinson. Pt AAOx4.  BP 140/84 SpO2 97% HR 60
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
## 2024-06-05 NOTE — Progress Notes (Signed)
 Orthopedics consulted for patient's left acetabular fracture. Due to the complexity of the fracture, we request patient transferred to [facility] for formal evaluation by our orthopedic traumatologist colleagues. Patient has been admitted to the hospitalist service. Bedrest for now. NPO midnight.  Aleck Stalling, PA-C 06/05/24

## 2024-06-05 NOTE — ED Notes (Signed)
 Patient back from CT.

## 2024-06-05 NOTE — ED Notes (Signed)
 [facility] called for transport.

## 2024-06-05 NOTE — ED Notes (Signed)
 Patient taken to Xray

## 2024-06-05 NOTE — ED Provider Notes (Cosign Needed Addendum)
 [place] EMERGENCY DEPARTMENT AT [facility] Provider Note   CSN: [id number] Arrival date & time: 06/05/24  1148     Patient presents with: Fall and Leg Pain   Kevin Valentine is a 83 y.o. male has medical history significant for dementia and Parkinson's presents today after a mechanical fall at home.  Per patient's son patient fell onto his left buttocks and left elbow.  Patient's son denies blood thinners or head injury.  Patient did have approximately 3 to 5 minutes of altered mental status where he was unresponsive and staring to the right during this episode.  Per patient's son patient is back to baseline at this time.    Fall  Leg Pain      Prior to Admission medications   Medication Sig Start Date End Date Taking? Authorizing Provider  acetaminophen  (TYLENOL ) 325 MG tablet Take 2 tablets (650 mg total) by mouth every 6 (six) hours as needed. 04/16/23   Elnor Jayson LABOR, DO  allopurinol (ZYLOPRIM) 300 MG tablet Take 300 mg by mouth daily.    [provider]  amLODipine (NORVASC) 5 MG tablet Take by mouth. 10/10/22   [provider]  aspirin EC 81 MG tablet Take 81 mg by mouth daily.    [provider]  atenolol (TENORMIN) 50 MG tablet Take 1 tablet by mouth daily. 10/24/22 10/24/23  [provider]  carbidopa-levodopa (SINEMET IR) 25-100 MG tablet Take by mouth. 06/09/22   [provider]  colchicine 0.6 MG tablet One po bid x3-4 days for an acute gout flare 10/29/18   [provider]  ibuprofen  (ADVIL ) 600 MG tablet Take 1 tablet (600 mg total) by mouth every 6 (six) hours as needed. 04/16/23   Elnor Jayson LABOR, DO  ondansetron  (ZOFRAN -ODT) 4 MG disintegrating tablet Take 1 tablet (4 mg total) by mouth every 6 (six) hours as needed for nausea or vomiting. 05/28/24   Dicky Anes, MD  traZODone (DESYREL) 100 MG tablet Take 100 mg by mouth at bedtime.    [provider]    Allergies: Patient has no known  allergies.    Review of Systems  Musculoskeletal:  Positive for arthralgias.    Updated Vital Signs BP 130/73 (BP Location: Right Arm)   Pulse 61   Temp 97.8 F (36.6 C) (Oral)   Resp 18   SpO2 96%   Physical Exam Vitals and nursing note reviewed.  Constitutional:      General: He is not in acute distress.    Appearance: Normal appearance. He is well-developed. He is not ill-appearing.  HENT:     Head: Normocephalic and atraumatic.     Right Ear: External ear normal.     Left Ear: External ear normal.     Nose: Nose normal.   Eyes:     Extraocular Movements: Extraocular movements intact.     Conjunctiva/sclera: Conjunctivae normal.     Comments: Patient with anisocoria, right pupil 1 to 2 mm and left 3 to 4 mm, both reactive to light   Cardiovascular:     Rate and Rhythm: Normal rate and regular rhythm.     Heart sounds: No murmur heard. Pulmonary:     Effort: Pulmonary effort is normal. No respiratory distress.     Breath sounds: Normal breath sounds.  Abdominal:     Palpations: Abdomen is soft.     Tenderness: There is no abdominal tenderness.   Musculoskeletal:        General: No  swelling, tenderness or deformity.     Cervical back: Neck supple.     Right lower leg: No edema.     Left lower leg: No edema.   Skin:    General: Skin is warm and dry.     Capillary Refill: Capillary refill takes less than 2 seconds.   Neurological:     General: No focal deficit present.     Mental Status: He is alert and oriented to person, place, and time.   Psychiatric:        Mood and Affect: Mood normal.     (all labs ordered are listed, but only abnormal results are displayed) Labs Reviewed  BASIC METABOLIC PANEL WITH GFR - Abnormal; Notable for the following components:      Result Value   Glucose, Bld 117 (*)    All other components within normal limits  CBC WITH DIFFERENTIAL/PLATELET - Abnormal; Notable for the following components:   WBC 14.6 (*)    Neutro Abs  12.3 (*)    Abs Immature Granulocytes 0.12 (*)    All other components within normal limits    EKG: None  Radiology: CT Hip Left Wo Contrast Result Date: 06/05/2024 CLINICAL DATA:  Left acetabular fracture EXAM: CT OF THE LEFT HIP WITHOUT CONTRAST TECHNIQUE: Multidetector CT imaging of the left hip was performed according to the standard protocol. Multiplanar CT image reconstructions were also generated. RADIATION DOSE REDUCTION: This exam was performed according to the departmental dose-optimization program which includes automated exposure control, adjustment of the mA and/or kV according to patient size and/or use of iterative reconstruction technique. COMPARISON:  Radiographs 06/05/2024 FINDINGS: Bones/Joint/Cartilage The left acetabular fracture has various components including a vertical component extending in the left iliac bone; a posterior component through the posterior wall; an anterior component through the anterior wall extending into the superior pubic ramus longitudinally; and a punched-out central component of the quadrilateral plate resulting in a posttraumatic mild protrusio appearance. A well-defined fracture of the inferior pubic ramus is not appreciated. No fracture component extending into the SI joint is observed. Mild left femoral head spurring. Ligaments Suboptimally assessed by CT. Muscles and Tendons Expansion and hematoma along the left obturator internus muscle as expected given the fracture of the quadrilateral plate. A small amount of hematoma also tracks along the left iliopsoas muscle in the lower pelvis. Edema/hematoma along the left pelvic sidewall. Soft tissues Substantial prostatomegaly, the prostate is only partially included on today's exam. No sciatic notch or sacral plexus impingement. No substantial impingement at the obturator foramen. IMPRESSION: 1. Complex left acetabular fracture with various components including a vertical component extending in the left iliac  bone; a posterior component through the posterior wall; an anterior component through the anterior wall extending into the superior pubic ramus longitudinally; and a punched-out central component of the quadrilateral plate resulting in a posttraumatic mild protrusio appearance. No well-defined fracture of the inferior pubic ramus is identified. 2. Expansion and hematoma along the left obturator internus muscle as expected given the fracture of the quadrilateral plate. A small amount of hematoma also tracks along the left iliopsoas muscle in the lower pelvis. Edema/hematoma along the left pelvic sidewall. 3. Substantial prostatomegaly, the prostate is only partially included on today's exam. Electronically Signed   By: Ryan Salvage M.D.   On: 06/05/2024 15:37   CT Angio Head Neck W WO CM Result Date: 06/05/2024 CLINICAL DATA:  Head trauma, moderate-severe. Witnessed mechanical fall. Anisocoria. EXAM: CT ANGIOGRAPHY HEAD AND NECK WITH  AND WITHOUT CONTRAST TECHNIQUE: Multidetector CT imaging of the head and neck was performed using the standard protocol during bolus administration of intravenous contrast. Multiplanar CT image reconstructions and MIPs were obtained to evaluate the vascular anatomy. Carotid stenosis measurements (when applicable) are obtained utilizing NASCET criteria, using the distal internal carotid diameter as the denominator. RADIATION DOSE REDUCTION: This exam was performed according to the departmental dose-optimization program which includes automated exposure control, adjustment of the mA and/or kV according to patient size and/or use of iterative reconstruction technique. CONTRAST:  75mL OMNIPAQUE  IOHEXOL  350 MG/ML SOLN COMPARISON:  Head CT 05/28/2024 and MRI 05/25/2021 FINDINGS: CT HEAD FINDINGS Brain: There is no evidence of an acute infarct, intracranial hemorrhage, mass, midline shift, or extra-axial fluid collection. Moderate cerebral atrophy is again noted. Cerebral white matter  hypodensities are unchanged and nonspecific but compatible with mild chronic small vessel ischemic disease. Vascular: No hyperdense vessel. Skull: No acute fracture or suspicious lesion. Sinuses/Orbits: Paranasal sinuses and mastoid air cells are clear. Bilateral cataract extraction. Other: Topogram demonstrates a known left acetabular fracture. Review of the MIP images confirms the above findings CTA NECK FINDINGS Aortic arch: Standard branching with mild atherosclerosis. Widely patent brachiocephalic and subclavian arteries. Right carotid system: Patent without evidence of stenosis, dissection, or significant atherosclerosis. Left carotid system: Patent without evidence of stenosis, dissection, or significant atherosclerosis. Vertebral arteries: Patent without evidence of stenosis, dissection, or significant atherosclerosis. Codominant. Skeleton: Spondylosis. Multiple Schmorl's nodes in the lower cervical and upper thoracic spine. No evidence of an acute fracture. Other neck: No evidence cervical lymphadenopathy or mass. Upper chest: No mass or consolidation in the lung apices. Review of the MIP images confirms the above findings CTA HEAD FINDINGS Anterior circulation: The internal carotid arteries are widely patent from skull base to carotid termini. ACAs and MCAs are patent without evidence of a proximal branch occlusion or significant proximal stenosis. No aneurysm is identified. Posterior circulation: The intracranial vertebral arteries are widely patent to the basilar. Patent left PICA, right AICA, and bilateral SCA origins are visualized. The basilar artery is widely patent. Posterior communicating arteries are diminutive or absent. Both PCAs are patent without evidence of a significant proximal stenosis. There is asymmetric attenuation of distal right PCA branch vessels. No aneurysm is identified. Venous sinuses: Not well evaluated due to arterial contrast timing. Anatomic variants: None. Review of the MIP  images confirms the above findings IMPRESSION: 1. No evidence of acute intracranial abnormality. 2. 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 3. No large vessel occlusion, significant proximal stenosis, aneurysm, or dissection in the head or neck. 4.  Aortic Atherosclerosis (ICD10-I70.0). Electronically Signed   By: Dasie Hamburg M.D.   On: 06/05/2024 15:33   DG Elbow Complete Left Result Date: 06/05/2024 CLINICAL DATA:  Fall. EXAM: LEFT ELBOW - COMPLETE 3+ VIEW COMPARISON:  None Available. FINDINGS: There is no evidence of fracture, dislocation, or joint effusion. There is no evidence of arthropathy or other focal bone abnormality. Soft tissues are unremarkable. IMPRESSION: Negative. Electronically Signed   By: Lynwood Landy Raddle M.D.   On: 06/05/2024 14:06   DG Femur 1V Left Result Date: 06/05/2024 CLINICAL DATA:  Fall. EXAM: LEFT FEMUR 1 VIEW COMPARISON:  May 05, 2023. FINDINGS: Moderately displaced left acetabular fracture is noted medially and potentially posteriorly. The femur is unremarkable. IMPRESSION: Grossly normal left femur. Moderately displaced left acetabular fracture as noted above. Electronically Signed   By: Lynwood Landy Raddle M.D.   On: 06/05/2024 14:04  DG Hip Unilat With Pelvis 2-3 Views Left Result Date: 06/05/2024 CLINICAL DATA:  Fall. EXAM: DG HIP (WITH OR WITHOUT PELVIS) 2-3V LEFT COMPARISON:  Apr 16, 2023. FINDINGS: Moderately displaced left acetabular fracture is noted medially and posteriorly. Proximal left femur is unremarkable. IMPRESSION: Moderately displaced left acetabular fracture. CT scan is recommended for further evaluation. Electronically Signed   By: Lynwood Landy Raddle M.D.   On: 06/05/2024 14:03   DG Ankle Complete Left Result Date: 06/05/2024 CLINICAL DATA:  Left ankle pain after fall. EXAM: LEFT ANKLE COMPLETE - 3+ VIEW COMPARISON:  None Available. FINDINGS: There is no evidence of fracture, dislocation, or joint effusion. There is no  evidence of arthropathy or other focal bone abnormality. Soft tissues are unremarkable. IMPRESSION: Negative. Electronically Signed   By: Lynwood Landy Raddle M.D.   On: 06/05/2024 14:02   DG Tibia/Fibula Left Result Date: 06/05/2024 CLINICAL DATA:  Fall. EXAM: LEFT TIBIA AND FIBULA - 2 VIEW COMPARISON:  Apr 16, 2023. FINDINGS: There is no evidence of fracture or other focal bone lesions. Soft tissues are unremarkable. IMPRESSION: Negative. Electronically Signed   By: Lynwood Landy Raddle M.D.   On: 06/05/2024 14:00     Procedures   Medications Ordered in the ED  iohexol  (OMNIPAQUE ) 350 MG/ML injection 75 mL (75 mLs Intravenous Contrast Given 06/05/24 1509)  morphine  (PF) 4 MG/ML injection 4 mg (4 mg Intravenous Given 06/05/24 1647)                                    Medical Decision Making Amount and/or Complexity of Data Reviewed Labs: ordered. Radiology: ordered.  Risk Prescription drug management. Decision regarding hospitalization.   This patient presents to the ED for concern of fall, this involves an extensive number of treatment options, and is a complaint that carries with it a high risk of complications and morbidity.  The differential diagnosis includes fracture, musculoskeletal pain, dislocation, head injury, TIA, stroke   Co morbidities / Chronic conditions that complicate the patient evaluation  Dementia and Parkinson's   Lab Tests:  I Ordered, and personally interpreted labs.  The pertinent results include: Leukocytosis at 14.6, BMP WNL   Imaging Studies ordered:  I ordered imaging studies including left ankle, elbow, femur, hip, and tib-fib x-rays I independently visualized and interpreted imaging which showed all negative with the exception of possible left displaced acetabular fracture I agree with the radiologist interpretation CT angio head neck which showed no evidence of acute intracranial abnormality CT left hip Noncon: Complex left acetabular fracture with  various components including a vertical component extending into the left iliac bone MRI brain Noncon pending   Problem List / ED Course / Critical interventions / Medication management  I ordered medication including Morphine    I have reviewed the patients home medicines and have made adjustments as needed   Consultations Obtained:  I requested consultation with the Hospitalist, Dr. Dena  and discussed lab and imaging findings as well as pertinent plan - they recommend: Admission to [facility] Neurology, Dr. Voncile who suggested MRI and if it is positive.  Having the hospitalist contact inpatient neurology for further evaluation workup, if it is negative the patient can have outpatient neurology follow-up. Kemp Aleck Stalling is agreeable to see the patient while inpatient and requesting admission to [facility].   Test / Admission - Considered:  Admit      Final diagnoses:  Fall, initial  encounter  Closed displaced fracture of left acetabulum, unspecified portion of acetabulum, initial encounter [facility]    ED Discharge Orders     None          Francis Ileana SAILOR, PA-C 06/05/24 1727    Francis Ileana SAILOR, PA-C 06/05/24 1757    Bari Roxie HERO, DO 06/06/24 [phone]
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
## 2024-06-05 NOTE — ED Notes (Signed)
 Patient transported to MRI

## 2024-06-05 NOTE — ED Notes (Signed)
 ED TO INPATIENT HANDOFF REPORT  Name/Age/Gender Kevin Valentine 83 y.o. male  Code Status    Code Status Orders  (From admission, onward)           Start     Ordered   06/05/24 1819  Full code  Continuous       Question:  By:  Answer:  Consent: discussion documented in EHR   06/05/24 1820           Code Status History     Date Active Date Inactive Code Status Order ID Comments User Context   06/26/2017 0354 06/27/2017 1857 Full Code [id number]  Wonda Charlie BRAVO, MD ED       Home/SNF/Other Home  Chief Complaint Hip fracture [facility]) [S72.009A]  Level of Care/Admitting Diagnosis ED Disposition     ED Disposition  Admit   Condition  --   Comment  Hospital Area: [facility] [[id number]]  Level of Care: Telemetry Medical [104]  May admit patient to [facility] or [facility] if equivalent level of care is available:: No  Covid Evaluation: Asymptomatic - no recent exposure (last 10 days) testing not required  Diagnosis: Hip fracture [facility]) [802020]  Admitting Physician: DENA CHARLESTON [[id number]]  Attending Physician: DENA CHARLESTON [[id number]]  Certification:: I certify this patient will need inpatient services for at least 2 midnights  Expected Medical Readiness: 06/07/2024          Medical History Past Medical History:  Diagnosis Date   Corticobasal syndrome (HCC)     Allergies No Known Allergies  IV Location/Drains/Wounds Patient Lines/Drains/Airways Status     Active Line/Drains/Airways     Name Placement date Placement time Site Days   Peripheral IV 06/05/24 Anterior;Distal;Right;Upper Antecubital 06/05/24  1346  Antecubital  less than 1   Closed System Drain 1 Right;Lateral Abdomen Bulb (JP) 15 Fr. 06/26/17  1242  Abdomen  2536   Incision - 4 Ports Abdomen Other (Comment) Right;Lateral;Lower 3: Anterior;Superior Anterior;Superior;Right 06/26/17  1141  -- 2536            Labs/Imaging Results for orders placed or performed  during the hospital encounter of 06/05/24 (from the past 48 hours)  Basic metabolic panel     Status: Abnormal   Collection Time: 06/05/24  1:38 PM  Result Value Ref Range   Sodium 138 135 - 145 mmol/L   Potassium 3.9 3.5 - 5.1 mmol/L   Chloride 104 98 - 111 mmol/L   CO2 25 22 - 32 mmol/L   Glucose, Bld 117 (H) 70 - 99 mg/dL    Comment: Glucose reference range applies only to samples taken after fasting for at least 8 hours.   BUN 15 8 - 23 mg/dL   Creatinine, Ser 8.82 0.61 - 1.24 mg/dL   Calcium 9.0 8.9 - 89.6 mg/dL   GFR, Estimated >39 >39 mL/min    Comment: (NOTE) Calculated using the CKD-EPI Creatinine Equation (2021)    Anion gap 9 5 - 15    Comment: Performed at [facility], [address], [place], [place] [address]  CBC with Differential     Status: Abnormal   Collection Time: 06/05/24  1:38 PM  Result Value Ref Range   WBC 14.6 (H) 4.0 - 10.5 K/uL   RBC 4.66 4.22 - 5.81 MIL/uL   Hemoglobin 14.8 13.0 - 17.0 g/dL   HCT 55.6 60.9 - 47.9 %   MCV 95.1 80.0 - 100.0 fL   MCH 31.8 26.0 - 34.0  pg   MCHC 33.4 30.0 - 36.0 g/dL   RDW 86.0 88.4 - 84.4 %   Platelets 191 150 - 400 K/uL   nRBC 0.0 0.0 - 0.2 %   Neutrophils Relative % 85 %   Neutro Abs 12.3 (H) 1.7 - 7.7 K/uL   Lymphocytes Relative 8 %   Lymphs Abs 1.2 0.7 - 4.0 K/uL   Monocytes Relative 6 %   Monocytes Absolute 0.9 0.1 - 1.0 K/uL   Eosinophils Relative 0 %   Eosinophils Absolute 0.1 0.0 - 0.5 K/uL   Basophils Relative 0 %   Basophils Absolute 0.1 0.0 - 0.1 K/uL   Immature Granulocytes 1 %   Abs Immature Granulocytes 0.12 (H) 0.00 - 0.07 K/uL    Comment: Performed at [facility], [address] [place], [place] [address]   MR BRAIN WO CONTRAST Result Date: 06/05/2024 CLINICAL DATA:  Initial evaluation for acute neuro deficit, stroke suspected. EXAM: MRI HEAD WITHOUT CONTRAST TECHNIQUE: Multiplanar, multiecho pulse sequences of the brain and surrounding structures were  obtained without intravenous contrast. COMPARISON:  Comparison made with CT from earlier the same day. FINDINGS: Brain: Examination is technically limited as the patient was unable to tolerate the full length of the exam. Diffusion-weighted sequences, axial FLAIR and SWI sequences only were performed. Additionally, provided images are severely degraded by motion artifact. 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 No visible foci of restricted diffusion to suggest acute or subacute ischemia. No visible acute or chronic intracranial blood products. No visible mass lesion, mass effect or midline shift. Ventricular prominence related global parenchymal volume loss without obvious hydrocephalus. No visible extra-axial fluid collection. Vascular: Not well assessed on this limited exam. Skull and upper cervical spine: Not well assessed on this limited exam. Sinuses/Orbits: Not well assessed on this limited exam. Other: None. IMPRESSION: 1. Technically limited truncated and motion degraded exam. 2. No visible acute intracranial abnormality. 3. 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 Electronically Signed   By: Morene Hoard M.D.   On: 06/05/2024 19:14   CT Hip Left Wo Contrast Result Date: 06/05/2024 CLINICAL DATA:  Left acetabular fracture EXAM: CT OF THE LEFT HIP WITHOUT CONTRAST TECHNIQUE: Multidetector CT imaging of the left hip was performed according to the standard protocol. Multiplanar CT image reconstructions were also generated. RADIATION DOSE REDUCTION: This exam was performed according to the departmental dose-optimization program which includes automated exposure control, adjustment of the mA and/or kV according to patient size and/or use of iterative reconstruction technique. COMPARISON:  Radiographs 06/05/2024 FINDINGS: Bones/Joint/Cartilage The left acetabular fracture has various components including a vertical component extending in  the left iliac bone; a posterior component through the posterior wall; an anterior component through the anterior wall extending into the superior pubic ramus longitudinally; and a punched-out central component of the quadrilateral plate resulting in a posttraumatic mild protrusio appearance. A well-defined fracture of the inferior pubic ramus is not appreciated. No fracture component extending into the SI joint is observed. Mild left femoral head spurring. Ligaments Suboptimally assessed by CT. Muscles and Tendons Expansion and hematoma along the left obturator internus muscle as expected given the fracture of the quadrilateral plate. A small amount of hematoma also tracks along the left iliopsoas muscle in the lower pelvis. Edema/hematoma along the left pelvic sidewall. Soft tissues Substantial prostatomegaly, the prostate is only partially included on today's exam. No sciatic notch or sacral plexus impingement. No substantial impingement at the obturator foramen. IMPRESSION:  1. Complex left acetabular fracture with various components including a vertical component extending in the left iliac bone; a posterior component through the posterior wall; an anterior component through the anterior wall extending into the superior pubic ramus longitudinally; and a punched-out central component of the quadrilateral plate resulting in a posttraumatic mild protrusio appearance. No well-defined fracture of the inferior pubic ramus is identified. 2. Expansion and hematoma along the left obturator internus muscle as expected given the fracture of the quadrilateral plate. A small amount of hematoma also tracks along the left iliopsoas muscle in the lower pelvis. Edema/hematoma along the left pelvic sidewall. 3. Substantial prostatomegaly, the prostate is only partially included on today's exam. Electronically Signed   By: Ryan Salvage M.D.   On: 06/05/2024 15:37   CT Angio Head Neck W WO CM Result Date:  06/05/2024 CLINICAL DATA:  Head trauma, moderate-severe. Witnessed mechanical fall. Anisocoria. EXAM: CT ANGIOGRAPHY HEAD AND NECK WITH AND WITHOUT CONTRAST TECHNIQUE: Multidetector CT imaging of the head and neck was performed using the standard protocol during bolus administration of intravenous contrast. Multiplanar CT image reconstructions and MIPs were obtained to evaluate the vascular anatomy. Carotid stenosis measurements (when applicable) are obtained utilizing NASCET criteria, using the distal internal carotid diameter as the denominator. RADIATION DOSE REDUCTION: This exam was performed according to the departmental dose-optimization program which includes automated exposure control, adjustment of the mA and/or kV according to patient size and/or use of iterative reconstruction technique. CONTRAST:  75mL OMNIPAQUE  IOHEXOL  350 MG/ML SOLN COMPARISON:  Head CT 05/28/2024 and MRI 05/25/2021 FINDINGS: CT HEAD FINDINGS Brain: There is no evidence of an acute infarct, intracranial hemorrhage, mass, midline shift, or extra-axial fluid collection. Moderate cerebral atrophy is again noted. Cerebral white matter hypodensities are unchanged and nonspecific but compatible with mild chronic small vessel ischemic disease. Vascular: No hyperdense vessel. Skull: No acute fracture or suspicious lesion. Sinuses/Orbits: Paranasal sinuses and mastoid air cells are clear. Bilateral cataract extraction. Other: Topogram demonstrates a known left acetabular fracture. Review of the MIP images confirms the above findings CTA NECK FINDINGS Aortic arch: Standard branching with mild atherosclerosis. Widely patent brachiocephalic and subclavian arteries. Right carotid system: Patent without evidence of stenosis, dissection, or significant atherosclerosis. Left carotid system: Patent without evidence of stenosis, dissection, or significant atherosclerosis. Vertebral arteries: Patent without evidence of stenosis, dissection, or  significant atherosclerosis. Codominant. Skeleton: Spondylosis. Multiple Schmorl's nodes in the lower cervical and upper thoracic spine. No evidence of an acute fracture. Other neck: No evidence cervical lymphadenopathy or mass. Upper chest: No mass or consolidation in the lung apices. Review of the MIP images confirms the above findings CTA HEAD FINDINGS Anterior circulation: The internal carotid arteries are widely patent from skull base to carotid termini. ACAs and MCAs are patent without evidence of a proximal branch occlusion or significant proximal stenosis. No aneurysm is identified. Posterior circulation: The intracranial vertebral arteries are widely patent to the basilar. Patent left PICA, right AICA, and bilateral SCA origins are visualized. The basilar artery is widely patent. Posterior communicating arteries are diminutive or absent. Both PCAs are patent without evidence of a significant proximal stenosis. There is asymmetric attenuation of distal right PCA branch vessels. No aneurysm is identified. Venous sinuses: Not well evaluated due to arterial contrast timing. Anatomic variants: None. Review of the MIP images confirms the above findings IMPRESSION: 1. No evidence of acute intracranial abnormality. 2. 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 3. No large vessel occlusion, significant proximal stenosis,  aneurysm, or dissection in the head or neck. 4.  Aortic Atherosclerosis (ICD10-I70.0). Electronically Signed   By: Dasie Hamburg M.D.   On: 06/05/2024 15:33   DG Elbow Complete Left Result Date: 06/05/2024 CLINICAL DATA:  Fall. EXAM: LEFT ELBOW - COMPLETE 3+ VIEW COMPARISON:  None Available. FINDINGS: There is no evidence of fracture, dislocation, or joint effusion. There is no evidence of arthropathy or other focal bone abnormality. Soft tissues are unremarkable. IMPRESSION: Negative. Electronically Signed   By: Lynwood Landy Raddle M.D.   On: 06/05/2024 14:06   DG  Femur 1V Left Result Date: 06/05/2024 CLINICAL DATA:  Fall. EXAM: LEFT FEMUR 1 VIEW COMPARISON:  May 05, 2023. FINDINGS: Moderately displaced left acetabular fracture is noted medially and potentially posteriorly. The femur is unremarkable. IMPRESSION: Grossly normal left femur. Moderately displaced left acetabular fracture as noted above. Electronically Signed   By: Lynwood Landy Raddle M.D.   On: 06/05/2024 14:04   DG Hip Unilat With Pelvis 2-3 Views Left Result Date: 06/05/2024 CLINICAL DATA:  Fall. EXAM: DG HIP (WITH OR WITHOUT PELVIS) 2-3V LEFT COMPARISON:  Apr 16, 2023. FINDINGS: Moderately displaced left acetabular fracture is noted medially and posteriorly. Proximal left femur is unremarkable. IMPRESSION: Moderately displaced left acetabular fracture. CT scan is recommended for further evaluation. Electronically Signed   By: Lynwood Landy Raddle M.D.   On: 06/05/2024 14:03   DG Ankle Complete Left Result Date: 06/05/2024 CLINICAL DATA:  Left ankle pain after fall. EXAM: LEFT ANKLE COMPLETE - 3+ VIEW COMPARISON:  None Available. FINDINGS: There is no evidence of fracture, dislocation, or joint effusion. There is no evidence of arthropathy or other focal bone abnormality. Soft tissues are unremarkable. IMPRESSION: Negative. Electronically Signed   By: Lynwood Landy Raddle M.D.   On: 06/05/2024 14:02   DG Tibia/Fibula Left Result Date: 06/05/2024 CLINICAL DATA:  Fall. EXAM: LEFT TIBIA AND FIBULA - 2 VIEW COMPARISON:  Apr 16, 2023. FINDINGS: There is no evidence of fracture or other focal bone lesions. Soft tissues are unremarkable. IMPRESSION: Negative. Electronically Signed   By: Lynwood Landy Raddle M.D.   On: 06/05/2024 14:00    Pending Labs Unresulted Labs (From admission, onward)     Start     Ordered   06/06/24 0500  Lipid panel  (Labs)  Tomorrow morning,   R       Comments: Fasting    06/05/24 1820   06/05/24 1819  Hemoglobin A1c  (Labs)  Once,   R       Comments: To assess prior glycemic control     06/05/24 1820            Vitals/Pain Today's Vitals   06/05/24 1158 06/05/24 1535 06/05/24 1711 06/05/24 1800  BP: (!) 161/98 130/73  130/73  Pulse: (!) 53 61  61  Resp: 18 18  18   Temp: 98 F (36.7 C) 97.8 F (36.6 C)  97.8 F (36.6 C)  TempSrc: Oral Oral  Oral  SpO2: 99% 96%  96%  PainSc:   3      Isolation Precautions No active isolations  Medications Medications  allopurinol (ZYLOPRIM) tablet 300 mg (has no administration in time range)  aspirin EC tablet 81 mg (has no administration in time range)  atenolol (TENORMIN) tablet 50 mg (has no administration in time range)  amLODipine (NORVASC) tablet 5 mg (has no administration in time range)  traZODone (DESYREL) tablet 100 mg (has no administration in time range)  carbidopa-levodopa (SINEMET IR) 25-100  MG per tablet immediate release 1 tablet (has no administration in time range)   stroke: early stages of recovery book (has no administration in time range)  0.9 %  sodium chloride  infusion (has no administration in time range)  acetaminophen  (TYLENOL ) tablet 650 mg (has no administration in time range)    Or  acetaminophen  (TYLENOL ) 160 MG/5ML solution 650 mg (has no administration in time range)    Or  acetaminophen  (TYLENOL ) suppository 650 mg (has no administration in time range)  enoxaparin (LOVENOX) injection 40 mg (has no administration in time range)  iohexol  (OMNIPAQUE ) 350 MG/ML injection 75 mL (75 mLs Intravenous Contrast Given 06/05/24 1509)  morphine  (PF) 4 MG/ML injection 4 mg (4 mg Intravenous Given 06/05/24 1647)  LORazepam (ATIVAN) injection 2 mg (2 mg Intravenous Given 06/05/24 1751)    Mobility walks with device

## 2024-06-06 ENCOUNTER — Inpatient Hospital Stay (HOSPITAL_COMMUNITY)

## 2024-06-06 DIAGNOSIS — S32402A Unspecified fracture of left acetabulum, initial encounter for closed fracture: Secondary | ICD-10-CM | POA: Diagnosis not present

## 2024-06-06 DIAGNOSIS — Z711 Person with feared health complaint in whom no diagnosis is made: Secondary | ICD-10-CM

## 2024-06-06 DIAGNOSIS — E66811 Obesity, class 1: Secondary | ICD-10-CM | POA: Insufficient documentation

## 2024-06-06 DIAGNOSIS — G20A1 Parkinson's disease without dyskinesia, without mention of fluctuations: Secondary | ICD-10-CM

## 2024-06-06 DIAGNOSIS — Z7189 Other specified counseling: Secondary | ICD-10-CM

## 2024-06-06 DIAGNOSIS — Z515 Encounter for palliative care: Secondary | ICD-10-CM

## 2024-06-06 DIAGNOSIS — I10 Essential (primary) hypertension: Secondary | ICD-10-CM | POA: Insufficient documentation

## 2024-06-06 LAB — LIPID PANEL
Cholesterol: 119 mg/dL (ref 0–200)
HDL: 49 mg/dL (ref >40)
LDL Cholesterol: 61 mg/dL (ref 0–99)
Total CHOL/HDL Ratio: 2.4 ratio
Triglycerides: 44 mg/dL (ref <150)
VLDL: 9 mg/dL (ref 0–40)

## 2024-06-06 LAB — ECHOCARDIOGRAM COMPLETE
AR max vel: 2.59 cm2
AV Area VTI: 2.88 cm2
AV Area mean vel: 2.6 cm2
AV Mean grad: 2 mmHg
AV Peak grad: 5.1 mmHg
Ao pk vel: 1.13 m/s
Area-P 1/2: 2.27 cm2
Calc EF: 71.2 %
S' Lateral: 3.1 cm
Single Plane A2C EF: 70.9 %
Single Plane A4C EF: 67.6 %

## 2024-06-06 MED ORDER — CARBIDOPA-LEVODOPA 25-100 MG PO TABS
1.0000 | ORAL_TABLET | Freq: Once | ORAL | Status: AC
  Administered 2024-06-06: 1 via ORAL
  Filled 2024-06-06: qty 1

## 2024-06-06 MED ORDER — PERFLUTREN LIPID MICROSPHERE
1.0000 mL | INTRAVENOUS | Status: DC | PRN
  Administered 2024-06-06: 2 mL via INTRAVENOUS

## 2024-06-06 MED ORDER — CARBIDOPA-LEVODOPA 25-100 MG PO TABS
2.0000 | ORAL_TABLET | Freq: Three times a day (TID) | ORAL | Status: DC
  Administered 2024-06-06 – 2024-06-10 (×12): 2 via ORAL
  Filled 2024-06-06 (×12): qty 2

## 2024-06-06 NOTE — Evaluation (Addendum)
 Physical Therapy Evaluation Patient Details Name: Kevin Valentine MRN: [id number] DOB: March 15, 1941 Today's Date: 06/06/2024  History of Present Illness  Pt is an 83 yo male presenting to [facility] On 06/05/24 following a mechanical fall found to have L acetabular tx. Pt and family have deferred sx and pt TDWB on LLE. PMH of corticobasal syndrome, ankle fusion, cholecystectomy.  Clinical Impression  PTA pt had assist with ADLs with mention that pt has been having difficulty ambulating for 3 months. Prior to that, pt would ambulate with a RW with a family member close by. Impaired cognition at baseline with pt distracted during session by lines and desire to return to supine. Pt required ModAx2 to MinAx2 for bed mobility. Once seated on the EOB, pt was CGA for safety. Attempted x2 stands with 2HH and propping of LLE to maintain WB precautions. Pt was unable to comprehend precautions and would attempt to push down on LLE. Limited use of LUE at baseline. Recommending post-acute rehab <3hrs to work towards independence with mobility. Pt would benefit from acute skilled PT with current functional limitations listed below (see PT Problem List). Acute PT to follow.         If plan is discharge home, recommend the following: A lot of help with walking and/or transfers;A lot of help with bathing/dressing/bathroom;Assistance with cooking/housework;Direct supervision/assist for medications management;Assist for transportation;Help with stairs or ramp for entrance;Direct supervision/assist for financial management   Can travel by private vehicle   No    Equipment Recommendations BSC/3in1;Wheelchair (measurements PT);Wheelchair cushion (measurements PT);Hoyer lift     Functional Status Assessment Patient has had a recent decline in their functional status and demonstrates the ability to make significant improvements in function in a reasonable and predictable amount of time.     Precautions / Restrictions  Precautions Precautions: Fall Recall of Precautions/Restrictions: Impaired Precaution/Restrictions Comments: corticobasal syndrome Restrictions Weight Bearing Restrictions Per Provider Order: Yes LLE Weight Bearing Per Provider Order: Touchdown weight bearing      Mobility  Bed Mobility Overal bed mobility: Needs Assistance Bed Mobility: Rolling, Sidelying to Sit, Sit to Supine Rolling: Mod assist, +2 for physical assistance, +2 for safety/equipment, Used rails Sidelying to sit: Min assist, +2 for physical assistance   Sit to supine: Mod assist, +2 for physical assistance, +2 for safety/equipment   General bed mobility comments: ModA/MinAx2 for mobility with pt able to bring R LE towards EOB. Assist needed to lift L LE. Slight assist to raise trunk. Pt initiating return to supine with assist for trunk and LLE management    Transfers Overall transfer level: Needs assistance Equipment used: 2 person hand held assist Transfers: Sit to/from Stand Sit to Stand: Max assist, +2 safety/equipment, +2 physical assistance    General transfer comment: Two attempts with MaxAx2 and 2HH. Therapist keeping LLE NWB with pt attempting to push down. Unable to maintain standing balance       Balance Overall balance assessment: Needs assistance, History of Falls, Mild deficits observed, not formally tested Sitting-balance support: Feet supported, No upper extremity supported Sitting balance-Leahy Scale: Good       Standing balance-Leahy Scale: Zero Standing balance comment: reliant on max A+2 from therapy.       Pertinent Vitals/Pain Pain Assessment Pain Assessment: Faces Faces Pain Scale: Hurts little more Pain Location: LLE with abduction. Pain Descriptors / Indicators: Grimacing, Guarding, Discomfort Pain Intervention(s): Limited activity within patient's tolerance, Monitored during session, Repositioned    Home Living Family/patient expects to be discharged to:: Skilled nursing  facility    Additional Comments: family looking for transition to SNF for LTC.    Prior Function Prior Level of Function : Needs assist;History of Falls (last six months)    Mobility Comments: Pt reports he was mobilizing with RW; family reports that he has not ambulated in 3 months. ADLs Comments: Pt reports having assist with ADLs from family.     Extremity/Trunk Assessment   Upper Extremity Assessment Upper Extremity Assessment: Defer to OT evaluation RUE Deficits / Details: Pt using RUE to touch top of head, fair grip strength. Action tremors RUE Coordination: decreased fine motor;decreased gross motor LUE Deficits / Details: Pt not using LUE spontanesouly, feels like tone in shoulder flexors, tremors with PROM. PROM WFL LUE Coordination: decreased fine motor;decreased gross motor    Lower Extremity Assessment Lower Extremity Assessment: RLE deficits/detail;LLE deficits/detail;Difficult to assess due to impaired cognition RLE Deficits / Details: At least 3/5, unable to formally MMT due to cognition LLE Deficits / Details: WFL ROM, at least 3/5 with deferring of formal MMT due to precautions and cognition. Pain with L hip ABD       Communication   Communication Communication: Impaired Factors Affecting Communication: Difficulty expressing self    Cognition Arousal: Alert Behavior During Therapy: Flat affect, Restless   PT - Cognitive impairments: History of cognitive impairments, Awareness, Memory, Attention, Sequencing, Problem solving, Safety/Judgement      PT - Cognition Comments: Externally distracted by pain and with IV line/catheter tubing. Frequent cues to adhere to task with visual demonstrations of commands. Following commands: Impaired Following commands impaired: Follows one step commands with increased time     Cueing Cueing Techniques: Gestural cues, Tactile cues, Verbal cues     General Comments General comments (skin integrity, edema, etc.): Discussion  with pt's family after session. Family anticipating rehab with transition to LTC     PT Assessment Patient needs continued PT services  PT Problem List Decreased strength;Decreased activity tolerance;Decreased balance;Decreased mobility;Decreased cognition;Decreased knowledge of use of DME;Decreased safety awareness;Decreased knowledge of precautions       PT Treatment Interventions DME instruction;Functional mobility training;Therapeutic activities;Therapeutic exercise;Balance training;Neuromuscular re-education;Patient/family education;Wheelchair mobility training    PT Goals (Current goals can be found in the Care Plan section)  Acute Rehab PT Goals Patient Stated Goal: pt did not state goal PT Goal Formulation: With patient/family Time For Goal Achievement: 06/20/24 Potential to Achieve Goals: Fair    Frequency Min 1X/week     Co-evaluation   Reason for Co-Treatment: Necessary to address cognition/behavior during functional activity;For patient/therapist safety;To address functional/ADL transfers PT goals addressed during session: Balance;Proper use of DME;Strengthening/ROM;Mobility/safety with mobility OT goals addressed during session: ADL's and self-care;Strengthening/ROM       AM-PAC PT 6 Clicks Mobility  Outcome Measure Help needed turning from your back to your side while in a flat bed without using bedrails?: Total Help needed moving from lying on your back to sitting on the side of a flat bed without using bedrails?: Total Help needed moving to and from a bed to a chair (including a wheelchair)?: Total Help needed standing up from a chair using your arms (e.g., wheelchair or bedside chair)?: Total Help needed to walk in hospital room?: Total Help needed climbing 3-5 steps with a railing? : Total 6 Click Score: 6    End of Session Equipment Utilized During Treatment: Gait belt Activity Tolerance: Patient tolerated treatment well Patient left: in bed;with call  bell/phone within reach;with bed alarm set Nurse Communication: Mobility status PT Visit  Diagnosis: Unsteadiness on feet (R26.81);Other abnormalities of gait and mobility (R26.89);Muscle weakness (generalized) (M62.81);History of falling (Z91.81)    Time: 8468-8443 PT Time Calculation (min) (ACUTE ONLY): 25 min   Charges:   PT Evaluation $PT Eval Moderate Complexity: 1 Mod   PT General Charges $$ ACUTE PT VISIT: 1 Visit    Kate ORN, PT, DPT Secure Chat Preferred  [facility] Office [phone]   [name] 06/06/2024, 5:12 PM

## 2024-06-06 NOTE — Progress Notes (Signed)
 Patient ID: AMDREW OBOYLE, male   DOB: 1941-11-08, 83 y.o.   MRN: [id number]  Failed bedside swallow per nightshift RN. Order for regular diet. Notified Ortho MD, attending and speech.   Verdie JONETTA Collier, RN

## 2024-06-06 NOTE — Plan of Care (Signed)
  Problem: Coping: Goal: Will identify appropriate support needs Outcome: Progressing

## 2024-06-06 NOTE — Progress Notes (Signed)
 Ortho Note  I was asked to consult for ortho trauma. Patient has acetabular fracture, will formally consult later this AM. No surgery will be planned today so I have given him a diet. Possible surgery tomorrow after discussion with patient and family.  Franky MYRTIS Light, MD [facility] [phone] (office) [email or web address]

## 2024-06-06 NOTE — Progress Notes (Signed)
 Pt unable to answer admission questions.

## 2024-06-06 NOTE — Progress Notes (Signed)
 OT Cancellation Note  Patient Details Name: Kevin Valentine MRN: [id number] DOB: March 09, 1941   Cancelled Treatment:    Reason Eval/Treat Not Completed: Patient not medically ready (Acute OT to hold as pt has an acetabular fx and awaiting for further plans of hip sx 6/27. OT to re-attempt as medically appropriate.)  06/06/2024  AB, OTR/L  Acute Rehabilitation Services  Office: [phone]   Curtistine JONETTA Das 06/06/2024, 11:16 AM

## 2024-06-06 NOTE — Plan of Care (Signed)
  Problem: SLP Dysphagia Goals Goal: Misc Dysphagia Goal Flowsheets (Taken 06/06/2024 0920) Misc Dysphagia Goal: Complete MBSS to determine safest PO diet

## 2024-06-06 NOTE — Assessment & Plan Note (Signed)
-
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
## 2024-06-06 NOTE — Hospital Course (Signed)
 83 y.o. M with obesity, Parkinson's disease, HTN who presented with fall and left hip pain.  CT in the ER showed acetabular fracture and small hematoma.  Orthopedics was consulted.

## 2024-06-06 NOTE — Progress Notes (Signed)
  Progress Note   Patient: Kevin Valentine FMW:[id number] DOB: 1941/09/01 DOA: 06/05/2024     1 DOS: the patient was seen and examined on 06/06/2024 at 8:55AM      Brief hospital course: 83 y.o. M with obesity, Parkinson's disease, HTN who presented with fall and left hip pain.  CT in the ER showed acetabular fracture and small hematoma.  Orthopedics was consulted.     Assessment and Plan: * Closed left acetabular fracture [facility]) Discussed with Orthopedics.   Benefits of surgical management are not clear, potential for difficult rehab, poor recovery. Given overall GOC, will defer surgery for now. - TDWB LLE - Analgesics - Appreciate orthopedics expertise    Concern for stroke There was some initial concern for gaze deviation, which was likely misinterpretation of his parkinsonian symptoms.  MRI was obtained to rule this out, and although low quality, no stroke was seen.  He currently has no focal neurological deficits.   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  He is not able to volunteer much in way of symptoms.  Nursing have no concerns.     Physical Exam: BP 120/65 (BP Location: Left Arm)   Pulse 72   Temp 98.9 F (37.2 C)   Resp 19   SpO2 97%   Elderly adult male, appears disheveled, tired, lying in bed RRR, no murmurs, no peripheral edema Respiratory rate normal, lungs clear without rales or wheezes, lung sounds diminished, poor respiratory effort, abdomen soft, no tenderness palpation, no ascites or distention He makes eye contact, but is sluggish with responses, oriented to self and hospital only, limited ability to participate in exam, masked facies, bradykinesia, generalized weakness, left leg testing not done because of pain     Data Reviewed: Discussed with  orthopedics Basic metabolic panel unremarkable CBC shows reactive leukocytosis, no anemia or thrombocytopenia  Family Communication:     Disposition: Status is: Inpatient         Author: Lonni SHAUNNA Dalton, MD 06/06/2024 2:06 PM  For on call review [email or web address].

## 2024-06-06 NOTE — Evaluation (Signed)
 Occupational Therapy Evaluation Patient Details Name: Kevin Valentine MRN: [id number] DOB: [birth year] Today's Date: 06/06/2024   History of Present Illness   Pt is an 83 yo male presenting to [facility] On 06/05/24 following a mechanical fall found to have L acetabular tx. Pt and family have deferred sx and pt TDWB on LLE. PMH of corticobasal syndrome, ankle fusion, cholecystectomy.     Clinical Impressions Pt admitted for above, PTA it is indicated that pt had assist with ADLs and has been nonambulatory for 3 months although he did use to ambulate with RW he stated. Pt with impaired cognition at baseline, presenting with L hip pain and impaired balance. Pt unable to maintain LLE WB precautions without ext assist, needing min A+2 to Mod A +2 for bed mobility and Max A +2 for OOB efforts. He also has strong action tremors oh his BUEs. OT to continue following pt acutely to progress pt as able. Patient will benefit from continued inpatient follow up therapy, <3 hours/day      If plan is discharge home, recommend the following:   A lot of help with walking and/or transfers;A lot of help with bathing/dressing/bathroom;Two people to help with walking and/or transfers;Two people to help with bathing/dressing/bathroom;Supervision due to cognitive status;Assistance with cooking/housework     Functional Status Assessment   Patient has had a recent decline in their functional status and demonstrates the ability to make significant improvements in function in a reasonable and predictable amount of time.     Equipment Recommendations   None recommended by OT (defer to next level of care)     Recommendations for Other Services         Precautions/Restrictions   Precautions Precautions: Fall Recall of Precautions/Restrictions: Impaired Precaution/Restrictions Comments: corticobasal syndrome Restrictions Weight Bearing Restrictions Per Provider Order: Yes LLE Weight Bearing Per Provider  Order: Touchdown weight bearing     Mobility Bed Mobility Overal bed mobility: Needs Assistance Bed Mobility: Rolling, Sidelying to Sit, Sit to Supine Rolling: Mod assist, +2 for physical assistance, +2 for safety/equipment, Used rails Sidelying to sit: Min assist, +2 for physical assistance   Sit to supine: Mod assist, +2 for physical assistance, +2 for safety/equipment        Transfers Overall transfer level: Needs assistance Equipment used: 2 person hand held assist Transfers: Sit to/from Stand Sit to Stand: Max assist, +2 safety/equipment, +2 physical assistance           General transfer comment: max A +2 with assist      Balance Overall balance assessment: Needs assistance Sitting-balance support: Feet supported, No upper extremity supported Sitting balance-Leahy Scale: Good       Standing balance-Leahy Scale: Zero Standing balance comment: reliant on max A+2 from therapy.                           ADL either performed or assessed with clinical judgement   ADL Overall ADL's : Needs assistance/impaired Eating/Feeding: Sitting;Moderate assistance Eating/Feeding Details (indicate cue type and reason): unsure if he uses AE at baseline for his tremors Grooming: Sitting;Wash/dry face;Minimal assistance Grooming Details (indicate cue type and reason): initiates task on command, min A + cues for thoroughness. Upper Body Bathing: Sitting;Moderate assistance   Lower Body Bathing: Sitting/lateral leans;Maximal assistance   Upper Body Dressing : Sitting;Maximal assistance   Lower Body Dressing: Total assistance     Toilet Transfer Details (indicate cue type and reason): not able to trasnsfer Toileting- Clothing  Manipulation and Hygiene: Total assistance;Bed level               Vision   Additional Comments: to be further assessed, no particular deficits noted.     Perception         Praxis         Pertinent Vitals/Pain Pain  Assessment Pain Assessment: Faces Faces Pain Scale: Hurts little more Pain Location: LLE with abduction. Pain Descriptors / Indicators: Grimacing, Guarding, Discomfort Pain Intervention(s): Monitored during session, Limited activity within patient's tolerance, Repositioned     Extremity/Trunk Assessment Upper Extremity Assessment Upper Extremity Assessment: LUE deficits/detail;Right hand dominant;RUE deficits/detail;Generalized weakness;Difficult to assess due to impaired cognition RUE Deficits / Details: Pt using RUE to touch top of head, fair grip strength. Action tremors RUE Coordination: decreased fine motor;decreased gross motor LUE Deficits / Details: Pt not using LUE spontanesouly, feels like tone in shoulder flexors, tremors with PROM. PROM WFL LUE Coordination: decreased fine motor;decreased gross motor   Lower Extremity Assessment Lower Extremity Assessment: Defer to PT evaluation       Communication Communication Communication: Impaired Factors Affecting Communication: Difficulty expressing self   Cognition Arousal: Alert Behavior During Therapy: Flat affect, Restless Cognition: History of cognitive impairments     Awareness: Intellectual awareness impaired, Online awareness impaired Memory impairment (select all impairments): Working [employer] fast streamer, Conservation officer, historic buildings Attention impairment (select first level of impairment): Sustained attention, Focused attention Executive functioning impairment (select all impairments): Reasoning, Problem solving, Sequencing                   Following commands: Impaired Following commands impaired: Follows one step commands with increased time     Cueing  General Comments   Cueing Techniques: Gestural cues;Tactile cues;Verbal cues  Spoke to patients family after session. They are already anticipating LTC.   Exercises     Shoulder Instructions      Home Living Family/patient expects to be discharged to::  Skilled nursing facility                                 Additional Comments: family looking for transition to SNF for LTC.      Prior Functioning/Environment               Mobility Comments: Pt reports he was mobilizing with RW; family reports that he has not ambulated in 3 months. ADLs Comments: Pt reports having assist with ADLs from family.    OT Problem List: Pain;Impaired balance (sitting and/or standing);Decreased activity tolerance;Decreased cognition;Decreased strength;Impaired UE functional use   OT Treatment/Interventions: DME and/or AE instruction;Therapeutic activities;Balance training;Patient/family education;Therapeutic exercise;Self-care/ADL training      OT Goals(Current goals can be found in the care plan section)   Acute Rehab OT Goals Patient Stated Goal: To transition to LTC facility OT Goal Formulation: With family Time For Goal Achievement: 06/20/24 Potential to Achieve Goals: Fair ADL Goals Pt Will Perform Grooming: sitting;with contact guard assist (using RUE) Pt Will Perform Lower Body Bathing: sitting/lateral leans;with min assist Pt Will Perform Upper Body Dressing: sitting;with min assist Pt/caregiver will Perform Home Exercise Program: Increased strength;Increased ROM;With Supervision;With written HEP provided (BLE strenghtening in prep for transfers) Additional ADL Goal #1: Pt will complete bed mobility with min A in prep for ADLs.   OT Frequency:  Min 1X/week    Co-evaluation              AM-PAC OT 6 Clicks  Daily Activity     Outcome Measure Help from another person eating meals?: A Lot Help from another person taking care of personal grooming?: A Little Help from another person toileting, which includes using toliet, bedpan, or urinal?: Total Help from another person bathing (including washing, rinsing, drying)?: A Lot Help from another person to put on and taking off regular upper body clothing?: A Lot Help from  another person to put on and taking off regular lower body clothing?: Total 6 Click Score: 11   End of Session Equipment Utilized During Treatment: Gait belt Nurse Communication: Mobility status;Other (comment) (hates the mitts)  Activity Tolerance: Patient tolerated treatment well Patient left: with call bell/phone within reach;in bed;with bed alarm set  OT Visit Diagnosis: Other abnormalities of gait and mobility (R26.89);Unsteadiness on feet (R26.81);History of falling (Z91.81);Pain Pain - Right/Left: Left Pain - part of body: Hip                Time: 1530-1555 OT Time Calculation (min): 25 min Charges:  OT General Charges $OT Visit: 1 Visit OT Evaluation $OT Eval Moderate Complexity: 1 Mod  06/06/2024  AB, OTR/L  Acute Rehabilitation Services  Office: [phone]   Curtistine JONETTA Das 06/06/2024, 4:25 PM

## 2024-06-06 NOTE — Progress Notes (Signed)
 SLP Cancellation Note  Patient Details Name: Kevin Valentine MRN: [id number] DOB: [birth year]   Cancelled treatment:       Reason Eval/Treat Not Completed: Patient at procedure or test/unavailable. Pt is NPO awaiting surgery.    [name] 06/06/2024, 7:21 AM

## 2024-06-06 NOTE — Assessment & Plan Note (Signed)
 Discussed with Orthopedics.   Benefits of surgical management are not clear, potential for difficult rehab, poor recovery. Given overall GOC, will defer surgery for now. - TDWB LLE - Analgesics - Appreciate orthopedics expertise

## 2024-06-06 NOTE — Progress Notes (Signed)
 PT Cancellation Note  Patient Details Name: Kevin Valentine MRN: [id number] DOB: Oct 20, 1941   Cancelled Treatment:    Reason Eval/Treat Not Completed: Patient not medically ready (Acute PT to hold as pt has an acetabular fx with plans for surgery 6/27. Acute PT to re-attempt as medically appropriate.)  [name], PT, DPT Secure Chat Preferred  Rehab Office [phone]  [name] 06/06/2024, 10:33 AM

## 2024-06-06 NOTE — Assessment & Plan Note (Signed)
 Blood pressure normal - Continue amlodipine, atenolol

## 2024-06-06 NOTE — Evaluation (Signed)
 Clinical/Bedside Swallow Evaluation Patient Details  Name: Kevin Valentine MRN: [id number] Date of Birth: [birth year]  Today's Date: 06/06/2024 Time: SLP Start Time (ACUTE ONLY): 0855 SLP Stop Time (ACUTE ONLY): 0911 SLP Time Calculation (min) (ACUTE ONLY): 16 min  Past Medical History:  Past Medical History:  Diagnosis Date   Corticobasal syndrome (HCC)    Past Surgical History:  Past Surgical History:  Procedure Laterality Date   ANKLE FUSION Right    CHOLECYSTECTOMY N/A 06/26/2017   Procedure: LAPAROSCOPIC CHOLECYSTECTOMY;  Surgeon: Shelva Dunnings, MD;  Location: [facility] ORS;  Service: General;  Laterality: N/A;   HERNIA REPAIR     HPI:  Kevin Valentine is a 83 y.o. male with medical history significant of dementia, Parkinson's who presented from home due to mechanical fall.  Patient's son stated that he fell on his left side and hurt his left elbow.  He has been having some worsening confusion and concern for L neglect since this episode.   MRI brain showed no acute intracranial abnormalities.  CT left hip showed left acetabular fracture with hematoma.  CTA head and neck showed no acute abnormalities.  X-ray of elbow showed no fracture, x-ray of ankle showed no fracture.  X-ray of femur showed hip fracture.    Assessment / Plan / Recommendation  Clinical Impression   Pt presents with concerns for pharyngeal dysphagia characterized by immediate and delayed coughing with >50% of thin liquid trials by cup. No overt or subtle s/s of aspiration noted with bites of applesauce or meds in applesauce (given by RN). Pt chewed med despite encouragement to swallow whole with applesauce. Oral phase judged to WNL for consistencies tried. Some subtle burping noted immediately following thin liquids, suspicious of an esophageal component.   Pt is a limited historian, though he did respond affirmatively when asked if coughing with liquids is normal for him. He did not answer further questions about  baseline diet or prior SLP intervention/evaluation. Attempted to call pt's son, Olanrewaju Osborn [phone]), though he did not answer and voicemail was full.  Recommend MBSS given overt s/s of aspiration with thin liquids, hx of dementia and Parkinson's, and acute deconditioning. MBSS scheduled for later this date, 6/26, @ 1300.   Recommend NPO with exception of meds crushed in applesauce and ice chips until MBSS can be completed.    SLP Visit Diagnosis: Dysphagia, unspecified (R13.10)    Aspiration Risk  Mild aspiration risk    Diet Recommendation NPO;Ice chips PRN after oral care    Medication Administration: Crushed with puree Supervision: Staff to assist with self feeding Compensations: Minimize environmental distractions;Slow rate;Small sips/bites    Other  Recommendations Oral Care Recommendations: Oral care QID     Assistance Recommended at Discharge  TBD; defer to PT/OT  Functional Status Assessment Patient has had a recent decline in their functional status and demonstrates the ability to make significant improvements in function in a reasonable and predictable amount of time.  Frequency and Duration min 1 x/week  2 weeks       Prognosis Prognosis for improved oropharyngeal function: Fair Barriers to Reach Goals: Cognitive deficits      Swallow Study   General Date of Onset: 06/05/24 HPI: Kevin Valentine is a 83 y.o. male with medical history significant of dementia, Parkinson's who presented from home due to mechanical fall.  Patient's son stated that he fell on his left side and hurt his left elbow.  He has been having some worsening confusion and concern for  L neglect since this episode.   MRI brain showed no acute intracranial abnormalities.  CT left hip showed left acetabular fracture with hematoma.  CTA head and neck showed no acute abnormalities.  X-ray of elbow showed no fracture, x-ray of ankle showed no fracture.  X-ray of femur showed hip fracture. Type of  Study: Bedside Swallow Evaluation Previous Swallow Assessment: none per chart Diet Prior to this Study: Regular;Thin liquids (Level 0) (failed [facility]) Temperature Spikes Noted: No Respiratory Status: Room air History of Recent Intubation: No Behavior/Cognition: Alert;Cooperative;Confused;Requires cueing Oral Cavity Assessment:  (limited assessment) Oral Care Completed by SLP: Recent completion by staff Oral Cavity - Dentition: Adequate natural dentition;Missing dentition (unable to completely view) Self-Feeding Abilities: Needs set up Patient Positioning: Upright in bed Baseline Vocal Quality: Normal Volitional Cough: Cognitively unable to elicit    Oral/Motor/Sensory Function Overall Oral Motor/Sensory Function:  (unable to formally assess, though appears WNL during PO trials)   Ice Chips Ice chips: Within functional limits Presentation: Spoon   Thin Liquid Thin Liquid: Impaired Presentation: Cup Pharyngeal  Phase Impairments: Throat Clearing - Immediate;Throat Clearing - Delayed    Nectar Thick Nectar Thick Liquid: Not tested   Honey Thick Honey Thick Liquid: Not tested   Puree Puree: Within functional limits Presentation: Spoon   Solid     Solid: Not tested      Kevin Valentine 06/06/2024,9:36 AM

## 2024-06-06 NOTE — Progress Notes (Signed)
*  PRELIMINARY RESULTS* Echocardiogram 2D Echocardiogram has been performed.  Eva JONETTA Lash 06/06/2024, 10:57 AM

## 2024-06-06 NOTE — Consult Note (Signed)
 Consultation Note Date: 06/06/2024   Patient Name: Kevin Valentine  DOB: 1941-04-12  MRN: [id number]  Age / Sex: 83 y.o., male  PCP: Jyl Railing, MD Referring Physician: Jonel Lonni SQUIBB, *  Reason for Consultation: Establishing goals of care  HPI/Patient Profile: 83 y.o. male  with past medical history of HTN, Parkinson's disease admitted on 06/05/2024 with fall and left hip pain.   In the ED, CT in the ER showed acetabular fracture and small hematoma. Family has declined surgical intervention after discussion with orthopedics.   PMT has been consulted to assist with goals of care conversation.  Clinical Assessment and Goals of Care:  I have reviewed medical records including EPIC notes, labs and imaging, discussed with RN, assessed the patient and then met in 3W conference room with patient's son and grandson to discuss diagnosis prognosis, GOC, EOL wishes, disposition and options.  I introduced Palliative Medicine as specialized medical care for people living with serious illness. It focuses on providing relief from the symptoms and stress of a serious illness. The goal is to improve quality of life for both the patient and the family.  We discussed a brief life review of the patient and then focused on their current illness.   I attempted to elicit values and goals of care important to the patient.    Medical History Review and Understanding:  We discussed patient's acute illness in the context of their chronic comorbidities. Patient's son and grandson understand the severity of patient's illness.   Social History: Patient was married for 44 years until his wife died of cancer. He has 1 son and 2 daughters, though completely estranged from 1 daughter and with very limited contact with the other daughter. He served in the [employer] 23 years and 15 years in the [place]. Family has difficulty obtaining support from [place] as his  problems are not service-connected. He enjoys watching TV and occasionally going out, though less able to not due to increased frequency of falls. He has been cared for by his grandson and then his son quit his job to help as well. They have been living in patient's house. He has had HH though did not participate well in PT.  Functional and Nutritional State: Patient has been gradually declining for some time, though this has rapidly worsened over the past 3-6 months.   Palliative Symptoms: 6/10 pain, agreeable to tylenol   Advance Directives: A detailed discussion regarding advanced directives was had. Family reports they completed durable POA recently. He has had living will for at least 20 years. No documentation currently on file.   Code Status: Recommended consideration of DNR status, understanding evidenced-based poor outcomes in similar hospitalized patients, as the cause of the arrest is likely associated with chronic/terminal disease rather than a reversible acute cardio-pulmonary event. DNR/DNI is more consistent with his wishes per AD.   Discussion: Practiced reflective listening as patient's family shared the difficult journey they have had attempting to honor his wishes to avoid facility placement. Caregiver burden has greatly increased as he continued to decline. Emotional support was provided. They were already considering how to change his care and anticipate his changing needs before he broke his hip, but this has expedited the timeline for them. They cannot afford LTC or caregivers without selling his home to pay for this. Patient has been resistant, as he build his home himself and returned from [facility] to live there when his wife was diagnosed with cancer. We discussed family conflict at  the time of her death and the difficulty in making decisions while she was on the ventilator. Patient has been clear that he would not want to end up like she did.  Patient is fearful of facility  placement from other family experiences as well and family hope that he can adjust well after seeing the changes in facilities over time - realizing that he will not just be tied down to a chair. Their hope is to take the time to prepare to sell his home while he is in short-term rehab. The overall focus is his comfort and peace, with no desire to aggressively prolong his poor QOL. A MOST form was introduced and an extensive conversation was had, covering concepts specific to code status, artifical feeding and hydration, continued IV antibiotics and rehospitalization. They would like to continue reviewing and revisit again tomorrow.    The difference between aggressive medical intervention and comfort care was considered in light of the patient's goals of care. Hospice and Palliative Care services outpatient were explained and offered.   Discussed the importance of continued conversation with family and the medical providers regarding overall plan of care and treatment options, ensuring decisions are within the context of the patient's values and GOCs.   Questions and concerns were addressed.  Hard Choices booklet left for review. The family was encouraged to call with questions or concerns.  PMT will continue to support holistically.    SUMMARY OF RECOMMENDATIONS   -Code status changed to DNR/DNI. Gold form was signed and placed in his chart. Will also scan copy into EMR -Continue supportive care. No surgery for hip fracture -Priority at this time is patient's comfort. Family would like to pursue SNF with palliative care f/u and then ultimately plan to transition him to LTC with hospice -MOST form introduced today -Psychosocial and emotional support provided -PMT will continue to follow and support  Prognosis:  Poor  Discharge Planning: Skilled Nursing Facility for rehab with Palliative care service follow-up      Primary Diagnoses: Present on Admission:  Closed left acetabular fracture  [facility])    Physical Exam Vitals and nursing note reviewed.  Constitutional:      General: He is not in acute distress.    Appearance: He is ill-appearing.   Cardiovascular:     Rate and Rhythm: Normal rate.  Pulmonary:     Effort: Pulmonary effort is normal.   Skin:    General: Skin is warm.   Neurological:     Mental Status: He is alert. Mental status is at baseline.   Psychiatric:        Mood and Affect: Mood normal.        Behavior: Behavior normal.     Vital Signs: BP 120/65 (BP Location: Left Arm)   Pulse 72   Temp 98.9 F (37.2 C)   Resp 19   SpO2 97%  Pain Scale: Faces   Pain Score: 3    SpO2: SpO2: 97 % O2 Device:SpO2: 97 % O2 Flow Rate: .    Total time: I spent 75 minutes in the care of the patient today in the above activities and documenting the encounter.   Danaja Lasota P Dyke Weible, PA-C  Palliative Medicine Team Team phone # [phone]  Thank you for allowing the Palliative Medicine Team to assist in the care of this patient. Please utilize secure chat with additional questions, if there is no response within 30 minutes please call the above phone number.  Palliative Medicine Team providers  are available by phone from 7am to 7pm daily and can be reached through the team cell phone.  Should this patient require assistance outside of these hours, please call the patient's attending physician.

## 2024-06-06 NOTE — Evaluation (Signed)
 Modified Barium Swallow Study  Patient Details  Name: Kevin Valentine MRN: [id number] Date of Birth: [birth year]  Today's Date: 06/06/2024  Modified Barium Swallow completed.  Full report located under Chart Review in the Imaging Section.  History of Present Illness Kevin Valentine is a 83 y.o. male with medical history significant for dementia, Parkinson's who presented from home due to mechanical fall.  Patient's son stated that he fell on his left side and hurt his left elbow.  He has been having some worsening confusion and concern for L neglect since this episode.   MRI brain showed no acute intracranial abnormalities.  CT left hip showed left acetabular fracture with hematoma.  CTA head and neck showed no acute abnormalities.  X-ray of elbow showed no fracture, x-ray of ankle showed no fracture.  X-ray of femur showed hip fracture.   Clinical Impression Pt presents with a moderate pharyngeal dysphagia per the DIGEST (see below) and a mild oral dysphagia.  The muscles of the pharynx and larynx worked with full ROM and adequate strength.  There was excellent pharyngeal clearance (no residuals).  Aspiration of thin liquids occurred on two occasions when liquids spilled into the larynx before laryngeal vestibule closure occurred.  Airway closure was timely for all other swallows. No esophageal residuals noted after taking pill whole with nectar liquids.  Given current mental status, recommend initiating a dysphagia 3 diet with nectar thick liquids.  SLP to follow and will work to advance liquids to thin during admission.  DIGEST Swallow Severity Rating*  Safety: 2  Efficiency: 0  Overall Pharyngeal Swallow Severity: 2- moderate   1: mild; 2: moderate; 3: severe; 4: profound  *The Dynamic Imaging Grade of Swallowing Toxicity is standardized for the head and neck cancer population, however, demonstrates promising clinical applications across populations to standardize the clinical rating  of pharyngeal swallow safety and severity.  Factors that Kevin increase risk of adverse event in presence of aspiration Kevin Valentine & Kevin Valentine 2021): Limited mobility;Reduced cognitive function  Swallow Evaluation Recommendations Recommendations: PO diet PO Diet Recommendation: Dysphagia 3 (Mechanical soft);Mildly thick liquids (Level 2, nectar thick) Liquid Administration via: Cup;Straw Medication Administration: Whole meds with liquid Supervision: Staff to assist with self-feeding Oral care recommendations: Oral care BID (2x/day)     Kevin Valentine L. Vona, MA CCC/SLP Clinical Specialist - Acute Care SLP Acute Rehabilitation Services Office number [phone]  Kevin Valentine 06/06/2024,1:57 PM

## 2024-06-06 NOTE — Assessment & Plan Note (Signed)
 There was some initial concern for gaze deviation, which was likely misinterpretation of his parkinsonian symptoms.  MRI was obtained to rule this out, and although low quality, no stroke was seen.  He currently has no focal neurological deficits.

## 2024-06-06 NOTE — Assessment & Plan Note (Signed)
 BMI 31, complicates care

## 2024-06-06 NOTE — Progress Notes (Signed)
 Patient ID: Kevin Valentine, male   DOB: 04/02/1941, 83 y.o.   MRN: [id number] Report called to %N and given to [facility]. Son, Ozell, called and informed of room change.  Verdie JONETTA Collier, RN

## 2024-06-07 DIAGNOSIS — Z7189 Other specified counseling: Secondary | ICD-10-CM | POA: Diagnosis not present

## 2024-06-07 DIAGNOSIS — S32402A Unspecified fracture of left acetabulum, initial encounter for closed fracture: Secondary | ICD-10-CM | POA: Diagnosis not present

## 2024-06-07 DIAGNOSIS — G20A1 Parkinson's disease without dyskinesia, without mention of fluctuations: Secondary | ICD-10-CM | POA: Diagnosis not present

## 2024-06-07 DIAGNOSIS — Z515 Encounter for palliative care: Secondary | ICD-10-CM | POA: Diagnosis not present

## 2024-06-07 LAB — CBC
HCT: 41 % (ref 39.0–52.0)
Hemoglobin: 13.6 g/dL (ref 13.0–17.0)
MCH: 31.4 pg (ref 26.0–34.0)
MCHC: 33.2 g/dL (ref 30.0–36.0)
MCV: 94.7 fL (ref 80.0–100.0)
Platelets: 152 10*3/uL (ref 150–400)
RBC: 4.33 MIL/uL (ref 4.22–5.81)
RDW: 14.1 % (ref 11.5–15.5)
WBC: 8.3 10*3/uL (ref 4.0–10.5)
nRBC: 0 % (ref 0.0–0.2)

## 2024-06-07 LAB — BASIC METABOLIC PANEL WITH GFR
Anion gap: 11 (ref 5–15)
BUN: 11 mg/dL (ref 8–23)
CO2: 21 mmol/L — ABNORMAL LOW (ref 22–32)
Calcium: 8.6 mg/dL — ABNORMAL LOW (ref 8.9–10.3)
Chloride: 105 mmol/L (ref 98–111)
Creatinine, Ser: 1.18 mg/dL (ref 0.61–1.24)
GFR, Estimated: >60 mL/min (ref >60)
Glucose, Bld: 104 mg/dL — ABNORMAL HIGH (ref 70–99)
Potassium: 3.7 mmol/L (ref 3.5–5.1)
Sodium: 137 mmol/L (ref 135–145)

## 2024-06-07 MED ORDER — ENSURE PLUS HIGH PROTEIN PO LIQD
237.0000 mL | Freq: Two times a day (BID) | ORAL | Status: DC
  Administered 2024-06-09 – 2024-06-10 (×4): 237 mL via ORAL

## 2024-06-07 NOTE — TOC CAGE-AID Note (Signed)
 Transition of Care [facility]) - CAGE-AID Screening   Patient Details  Name: Kevin Valentine MRN: [id number] Date of Birth: Dec 27, 1940  Transition of Care [facility]) CM/SW Contact:    Cherylee Rawlinson E Letha Mirabal, LCSW Phone Number: 06/07/2024, 10:00 AM   Clinical Narrative: Memory impairment.   CAGE-AID Screening: Substance Abuse Screening unable to be completed due to: : Patient unable to participate

## 2024-06-07 NOTE — TOC Initial Note (Signed)
 Transition of Care [facility]) - Initial/Assessment Note    Patient Details  Name: Kevin Valentine MRN: [id number] Date of Birth: [birth year]  Transition of Care [facility]) CM/SW Contact:    Bridget Cordella Simmonds, LCSW Phone Number: 06/07/2024, 12:29 PM  Clinical Narrative:      Pt oriented x1, CSW spoke with pt son Ozell by phone.  Discussed PT recommendation for SNF and Ozell in agreement, requesting options in [place].  Pt lives with Ozell, no current services.  Permission given to send out referral in the hub.   Referral sent out in hub for SNF.  PASSR requires additional info.             Expected Discharge Plan: Skilled Nursing Facility Barriers to Discharge: Continued Medical Work up, SNF Pending bed offer   Patient Goals and CMS Choice   CMS Medicare.gov Compare Post Acute Care list provided to:: Patient Represenative (must comment) (son Ozell)   [place] ownership interest in [facility].provided to:: Adult Children    Expected Discharge Plan and Services In-house Referral: Clinical Social Work   Post Acute Care Choice: Skilled Nursing Facility Living arrangements for the past 2 months: Single Family Home                                      Prior Living Arrangements/Services Living arrangements for the past 2 months: Single Family Home Lives with:: Adult Children (son Ozell) Patient language and need for interpreter reviewed:: Yes        Need for Family Participation in Patient Care: Yes (Comment) Care giver support system in place?: Yes (comment) Current home services: Other (comment) (none) Criminal Activity/Legal Involvement Pertinent to Current Situation/Hospitalization: No - Comment as needed  Activities of Daily Living      Permission Sought/Granted                  Emotional Assessment Appearance:: Appears stated age Attitude/Demeanor/Rapport: Engaged Affect (typically observed): Pleasant Orientation: : Oriented to  Self      Admission diagnosis:  Hip fracture (HCC) [S72.009A] Fall, initial encounter [W19.XXXA] Closed displaced fracture of left acetabulum, unspecified portion of acetabulum, initial encounter [facility]) [S32.402A] Patient Active Problem List   Diagnosis Date Noted   Parkinson's disease (HCC) 06/06/2024   Essential hypertension 06/06/2024   Class 1 obesity 06/06/2024   Concern for stroke 06/06/2024   Closed left acetabular fracture (HCC) 06/05/2024   PCP:  Jyl Railing, MD Pharmacy:   [id number] - [place], [place] - [address] AT [facility] [address] [facility] [address] & [address] [place] [address] Phone: [phone] Fax: [phone]     Social Drivers of Health (SDOH) Social History: SDOH Screenings   Food Insecurity: No Food Insecurity (03/11/2024)   Received from [facility]  Housing: Low Risk  (03/11/2024)   Received from [facility]  Transportation Needs: No Transportation Needs (03/11/2024)   Received from [facility]  Utilities: Not At Risk (03/11/2024)   Received from [facility]  Financial Resource Strain: Patient Declined (03/11/2024)   Received [facility]  Physical Activity: Unknown (12/10/2018)   Received from [facility]  Tobacco Use: Low Risk  (06/05/2024)  Recent Concern: Tobacco Use - Medium Risk (03/25/2024)   Received from [facility]   SDOH Interventions:  Readmission Risk Interventions     No data to display

## 2024-06-07 NOTE — Progress Notes (Signed)
 Daily Progress Note   Patient Name: Kevin Valentine       Date: 06/07/2024 DOB: Aug 10, 1941  Age: 83 y.o. MRN#: [id number] Attending Physician: Kevin Valentine, * Primary Care Physician: Kevin Railing, MD Admit Date: 06/05/2024  Reason for Consultation/Follow-up: Establishing goals of care  Subjective: Medical records reviewed including progress notes, labs, imaging, MAR. Patient assessed at the bedside.  He reports 6 out of 10 hip pain.  Agreeable to Tylenol .  Discussed with RN.  No family was present during the visit.  Called patient's son Kevin Valentine for ongoing goals of care conversations and palliative support.  They have been working on patient's home today and will not be in person until after 3 PM today.  They have not yet had the opportunity to further review MOST form.  I shared that I had a scheduling conflict and unable to visit in person later this afternoon, though I would ask my colleagues to follow-up with him over the weekend for MOST form completion and any additional palliative needs.  He verbalizes understanding and appreciation.  Questions and concerns addressed. PMT will continue to support holistically.   Length of Stay: 2   Physical Exam Vitals and nursing note reviewed.  Constitutional:      General: He is not in acute distress.  Cardiovascular:     Rate and Rhythm: Normal rate.  Pulmonary:     Effort: Pulmonary effort is normal.   Neurological:     Mental Status: He is alert. Mental status is at baseline.   Psychiatric:        Behavior: Behavior normal.            Vital Signs: BP 132/83 (BP Location: Left Arm)   Pulse 66   Temp 98.2 F (36.8 C) (Oral)   Resp 16   SpO2 95%  SpO2: SpO2: 95 % O2 Device: O2 Device: Room Air O2 Flow Rate:    Palliative Care Assessment & Plan   Patient  Profile: 83 y.o. male  with past medical history of HTN, Parkinson's disease admitted on 06/05/2024 with fall and left hip pain.    In the ED, CT in the ER showed acetabular fracture and small hematoma. Family has declined surgical intervention after discussion with orthopedics.    PMT has been consulted to assist with goals of care conversation.  Assessment: Goals of care conversation Parkinson's disease Left hip fracture  Recommendations/Plan: Continue DNR/DNI Goals are clear for SNF placement and outpatient palliative care follow-up; ultimate goal is to sell patient's home to pay for long-term care needs.  Anticipate transition to hospice once arrangements can be made for long-term care Patient's family would like to complete MOST form tomorrow if possible Psychosocial and emotional support provided Patient to continue to follow and support   Prognosis: Poor  Discharge Planning: Skilled Nursing Facility for rehab with Palliative care service follow-up   Care plan was discussed with patient, patient's son, RN   Total time: I spent 25 minutes in the care of the patient today in the above activities and documenting the encounter.   Kevin Casimir P Tahiri Shareef, PA-C  Palliative Medicine Team Team phone # [phone]  Thank you for allowing the Palliative Medicine  Team to assist in the care of this patient. Please utilize secure chat with additional questions, if there is no response within 30 minutes please call the above phone number.  Palliative Medicine Team providers are available by phone from 7am to 7pm daily and can be reached through the team cell phone.  Should this patient require assistance outside of these hours, please call the patient's attending physician.

## 2024-06-07 NOTE — Progress Notes (Signed)
 Speech Language Pathology Treatment: Dysphagia  Patient Details Name: Kevin Valentine MRN: [id number] DOB: 01-28-1941 Today's Date: 06/07/2024 Time: 8564-8546 SLP Time Calculation (min) (ACUTE ONLY): 18 min  Assessment / Plan / Recommendation Clinical Impression  Mr. Polanco was alert and participatory. He stated his hip was hurting -RN notified. Accepted eight oz nectar-thick liquids, which he quickly consumed without difficulty and without s/s of aspiration. Provided trials of individual sips of thin water - coughing was immediate and consistent, regardless of bolus size.  He required assistance with self-feeding.  Intermittent verbal cues provided.  New swallowing sign hung at [facility].  Continue dysphagia 3, nectar thick liquids for now.  SLP in next venue of care will not need to repeat MBS in order to advance liquids (cough response was a reliable indicator of aspiration per yesterday's MBS.) Will continue to follow while admitted.    HPI HPI: Kevin Valentine is a 83 y.o. male with medical history significant for dementia, Parkinson's who presented from home due to mechanical fall.  Patient's son stated that he fell on his left side and hurt his left elbow.  He has been having some worsening confusion and concern for L neglect since this episode.   MRI brain showed no acute intracranial abnormalities.  CT left hip showed left acetabular fracture with hematoma.  CTA head and neck showed no acute abnormalities.  X-ray of elbow showed no fracture, x-ray of ankle showed no fracture.  X-ray of femur showed hip fracture.      SLP Plan  Continue with current plan of care          Recommendations  Diet recommendations: Dysphagia 3 (mechanical soft);Nectar-thick liquid Liquids provided via: Cup;Straw Medication Administration: Whole meds with puree Supervision: Staff to assist with self feeding Compensations: Minimize environmental distractions                  Oral care BID    Frequent or constant Supervision/Assistance Dysphagia, oropharyngeal phase (R13.12)     Continue with current plan of care  Rena Hunke L. Vona, MA CCC/SLP Clinical Specialist - Acute Care SLP Acute Rehabilitation Services Office number [phone]    [name]  06/07/2024, 2:53 PM

## 2024-06-07 NOTE — Progress Notes (Signed)
 RE:  Kevin Valentine       Date of Birth: 12-16-2040      Date:   06/07/24       To Whom It May Concern:  Please be advised that the above-named patient will require a short-term nursing home stay - anticipated 30 days or less for rehabilitation and strengthening.  The plan is for return home.                 MD signature                Date

## 2024-06-07 NOTE — Progress Notes (Signed)
  Progress Note   Patient: Kevin Valentine FMW:[id number] DOB: [birth year] DOA: 06/05/2024     2 DOS: the patient was seen and examined on 06/07/2024      Brief hospital course: 83 y.o. M with obesity, Parkinson's disease, HTN who presented with fall and left hip pain.  CT in the ER showed acetabular fracture and small hematoma.  Orthopedics was consulted.     Assessment and Plan: * Closed left acetabular fracture [facility]) Discussed with Orthopedics.   Benefits of surgical management are not clear, potential for difficult rehab, poor recovery. Given overall GOC, will defer surgery for now. - TDWB LLE - Analgesics - Appreciate orthopedics expertise    Concern for stroke There was some initial concern for gaze deviation, which was likely misinterpretation of his parkinsonian symptoms.  MRI was obtained to rule this out, and although low quality, no stroke was seen.  He currently has no focal neurological deficits.   Class 1 obesity BMI 31, complicates care  Essential hypertension Blood pressure normal - Continue amlodipine , atenolol   Parkinson's disease (HCC) - Continue Sinemet           Subjective: Worked with PT yestday, difficulty with TDWB precautions.  Worked with SLP again today.     Physical Exam: BP 121/73 (BP Location: Right Arm)   Pulse 62   Temp 98 F (36.7 C) (Oral)   Resp 16   SpO2 94%   Elderly adult male, appears disheveled, tired, lying in bed RRR, no murmurs, no peripheral edema Respiratory rate normal, lungs clear without rales or wheezes, lung sounds diminished, poor respiratory effort, abdomen soft, no tenderness palpation, no ascites or distention He makes eye contact, but is sluggish with responses, oriented to self and hospital only, limited ability to participate in exam, masked facies, bradykinesia, generalized weakness, left leg testing not done because of pain     Data Reviewed: CBC and BMP reassuring  Family Communication:   Son yesterday afternoon    Disposition: Status is: Inpatient         Author: Lonni SHAUNNA Dalton, MD 06/07/2024 4:09 PM  For on call review [email or web address].

## 2024-06-07 NOTE — NC FL2 (Signed)
 [place]  MEDICAID FL2 LEVEL OF CARE FORM     IDENTIFICATION  Patient Name: Kevin Valentine Birthdate: 1941/05/08 Sex: male Admission Date (Current Location): 06/05/2024  [facility] and [place] Number:  Producer, television/film/video and Address:  [facility] [place], [address], [place], [place] [address]      Provider Number: [id number]  Attending Physician Name and Address:  Jonel Lonni SQUIBB, *  Relative Name and Phone Number:  Cristopher, Ciccarelli [phone]    Current Level of Care: Hospital Recommended Level of Care: Skilled Nursing Facility Prior Approval Number:    Date Approved/Denied:   PASRR Number:    Discharge Plan: SNF    Current Diagnoses: Patient Active Problem List   Diagnosis Date Noted   Parkinson's disease (HCC) 06/06/2024   Essential hypertension 06/06/2024   Class 1 obesity 06/06/2024   Concern for stroke 06/06/2024   Closed left acetabular fracture (HCC) 06/05/2024    Orientation RESPIRATION BLADDER Height & Weight     Self  Normal External catheter Weight:   Height:     BEHAVIORAL SYMPTOMS/MOOD NEUROLOGICAL BOWEL NUTRITION STATUS        Diet (see discharge summary)  AMBULATORY STATUS COMMUNICATION OF NEEDS Skin   Total Care Verbally Other (Comment) (ecchymosis)                       Personal Care Assistance Level of Assistance  Bathing, Feeding, Dressing, Total care Bathing Assistance: Maximum assistance Feeding assistance: Limited assistance Dressing Assistance: Maximum assistance Total Care Assistance: Maximum assistance   Functional Limitations Info  Sight, Hearing, Speech Sight Info: Adequate Hearing Info: Impaired Speech Info: Adequate    SPECIAL CARE FACTORS FREQUENCY  PT (By licensed PT), OT (By licensed OT)     PT Frequency: 5x week OT Frequency: 5x week            Contractures Contractures Info: Not present    Additional Factors Info  Code Status, Allergies Code Status Info:  DNR Allergies Info: NKA           Current Medications (06/07/2024):  This is the current hospital active medication list Current Facility-Administered Medications  Medication Dose Route Frequency Provider Last Rate Last Admin   acetaminophen  (TYLENOL ) tablet 650 mg  650 mg Oral Q4H PRN Dena Charleston, MD   650 mg at 06/07/24 9146   Or   acetaminophen  (TYLENOL ) 160 MG/5ML solution 650 mg  650 mg Per Tube Q4H PRN Dena Charleston, MD       Or   acetaminophen  (TYLENOL ) suppository 650 mg  650 mg Rectal Q4H PRN Dorrell, Robert, MD       allopurinol  (ZYLOPRIM ) tablet 300 mg  300 mg Oral Daily Dena Charleston, MD   300 mg at 06/07/24 9147   amLODipine  (NORVASC ) tablet 5 mg  5 mg Oral Daily Dena Charleston, MD   5 mg at 06/07/24 9147   aspirin  EC tablet 81 mg  81 mg Oral Daily Dena Charleston, MD   81 mg at 06/07/24 9147   atenolol  (TENORMIN ) tablet 50 mg  50 mg Oral Daily Dorrell, Robert, MD   50 mg at 06/07/24 9146   carbidopa -levodopa  (SINEMET  IR) 25-100 MG per tablet immediate release 2 tablet  2 tablet Oral TID Jonel Lonni SQUIBB, MD   2 tablet at 06/07/24 0853   enoxaparin  (LOVENOX ) injection 40 mg  40 mg Subcutaneous Q24H Dena Charleston, MD   40 mg at 06/06/24 2300   traZODone  (DESYREL )  tablet 100 mg  100 mg Oral QHS Dorrell, Robert, MD   100 mg at 06/06/24 2300     Discharge Medications: Please see discharge summary for a list of discharge medications.  Relevant Imaging Results:  Relevant Lab Results:   Additional Information SSN:[id number]  Bridget Cordella Simmonds, LCSW

## 2024-06-07 NOTE — Progress Notes (Signed)
 RE:  Kevin Valentine       Date of Birth:  2041-03-22     Date:   06/07/24       To Whom It May Concern:  Please be advised that the above-named patient has a primary diagnosis of dementia which supersedes any psychiatric diagnosis.                 MD signature                Date

## 2024-06-07 NOTE — TOC Initial Note (Signed)
 Transition of Care [facility]) - Initial/Assessment Note    Patient Details  Name: Kevin Valentine MRN: [id number] Date of Birth: 05-18-1941  [facility]) CM/SW Contact:    Almarie CHRISTELLA Goodie, LCSW Phone Number: 06/07/2024, 2:37 PM  Clinical Narrative:       CSW spoke with patient's son, Ozell, per request to discuss disposition. Patient is from home with son but has been having a decline; he used to be able to walk around on his own, but recently they have stopped taking him out to the park to walk because he would grow tired and they do not take him out of the house much. Son has been afraid that the patient has been moving towards needing placement but they have not moved forward yet, now he feels that he's reached that point. CSW answered son's questions and discussed next steps, including whether the patient will need surgery or not, and that therapy will need to evaluate for recommendations. Son in agreement with SNF, and also likely need for LTC afterwards. CSW discussed sending a referral to [facility] to assist with LTC, son in agreement. Son appreciative of information, and indicated that therapy was heading in to the room as the call ended. CSW to follow.            Expected Discharge Plan: Skilled Nursing Facility Barriers to Discharge: Continued Medical Work up, SNF Pending bed offer   Patient Goals and CMS Choice   CMS Medicare.gov Compare Post Acute Care list provided to:: Patient Represenative (must comment) (son Ozell)   [place] ownership interest in [facility].provided to:: Adult Children    Expected Discharge Plan and Services In-house Referral: Clinical Social Work   Post Acute Care Choice: Skilled Nursing Facility Living arrangements for the past 2 months: Single Family Home                                      Prior Living Arrangements/Services Living arrangements for the past 2 months: Single Family Home Lives with::  Adult Children (son Ozell) Patient language and need for interpreter reviewed:: Yes        Need for Family Participation in Patient Care: Yes (Comment) Care giver support system in place?: Yes (comment) Current home services: Other (comment) (none) Criminal Activity/Legal Involvement Pertinent to Current Situation/Hospitalization: No - Comment as needed  Activities of Daily Living      Permission Sought/Granted                  Emotional Assessment Appearance:: Appears stated age Attitude/Demeanor/Rapport: Engaged Affect (typically observed): Pleasant Orientation: : Oriented to Self      Admission diagnosis:  Hip fracture (HCC) [S72.009A] Fall, initial encounter [W19.XXXA] Closed displaced fracture of left acetabulum, unspecified portion of acetabulum, initial encounter [facility]) [S32.402A] Patient Active Problem List   Diagnosis Date Noted   Parkinson's disease (HCC) 06/06/2024   Essential hypertension 06/06/2024   Class 1 obesity 06/06/2024   Concern for stroke 06/06/2024   Closed left acetabular fracture (HCC) 06/05/2024   PCP:  Jyl Railing, MD Pharmacy:   [id number] - [place], [place] - [address] AT [facility] OF [address] [facility] [address] & [address]TH [address] [place] [address] Phone: [phone] Fax: [phone]     Social Drivers of Health (SDOH) Social History: SDOH Screenings   Food Insecurity: No Food Insecurity (03/11/2024)  Received [facility]  Housing: Low Risk  (03/11/2024)   Received from [facility]  Transportation Needs: No Transportation Needs (03/11/2024)   Received from [facility]  Utilities: Not At Risk (03/11/2024)   Received from [facility]  Financial Resource Strain: Patient Declined (03/11/2024)   Received from [facility]  Physical Activity: Unknown (12/10/2018)   [facility] from [facility]  Tobacco Use: Low  Risk  (06/05/2024)  Recent Concern: Tobacco Use - Medium Risk (03/25/2024)   Received from [facility]   SDOH Interventions:     Readmission Risk Interventions     No data to display

## 2024-06-08 DIAGNOSIS — Z7189 Other specified counseling: Secondary | ICD-10-CM | POA: Diagnosis not present

## 2024-06-08 DIAGNOSIS — G20A1 Parkinson's disease without dyskinesia, without mention of fluctuations: Secondary | ICD-10-CM | POA: Diagnosis not present

## 2024-06-08 DIAGNOSIS — S32402A Unspecified fracture of left acetabulum, initial encounter for closed fracture: Secondary | ICD-10-CM | POA: Diagnosis not present

## 2024-06-08 DIAGNOSIS — W19XXXA Unspecified fall, initial encounter: Secondary | ICD-10-CM | POA: Diagnosis not present

## 2024-06-08 DIAGNOSIS — Z515 Encounter for palliative care: Secondary | ICD-10-CM | POA: Diagnosis not present

## 2024-06-08 NOTE — Progress Notes (Signed)
 Daily Progress Note   Patient Name: Kevin Valentine       Date: 06/08/2024 DOB: 09/30/1941  Age: 83 y.o. MRN#: [id number] Attending Physician: Kevin Valentine, * Primary Care Physician: Kevin Railing, MD Admit Date: 06/05/2024  Reason for Consultation/Follow-up: Establishing goals of care  Length of Stay: 3  Current Medications: Scheduled Meds:   allopurinol   300 mg Oral Daily   amLODipine   5 mg Oral Daily   aspirin  EC  81 mg Oral Daily   atenolol   50 mg Oral Daily   carbidopa -levodopa   2 tablet Oral TID   enoxaparin  (LOVENOX ) injection  40 mg Subcutaneous Q24H   feeding supplement  237 mL Oral BID BM   traZODone   100 mg Oral QHS    Continuous Infusions:   PRN Meds: acetaminophen  **OR** acetaminophen  (TYLENOL ) oral liquid 160 mg/5 mL **OR** acetaminophen   Physical Exam Vitals reviewed.  Constitutional:      General: He is not in acute distress.    Appearance: He is not ill-appearing.  HENT:     Head: Normocephalic and atraumatic.   Cardiovascular:     Rate and Rhythm: Normal rate.  Pulmonary:     Effort: Pulmonary effort is normal.   Skin:    General: Skin is warm and dry.   Neurological:     Mental Status: He is alert. Mental status is at baseline.   Psychiatric:        Mood and Affect: Mood normal.        Behavior: Behavior normal.             Vital Signs: BP 129/68 (BP Location: Right Arm)   Pulse 65   Temp 98.7 F (37.1 C) (Oral)   Resp 16   SpO2 94%  SpO2: SpO2: 94 % O2 Device: O2 Device: Room Air O2 Flow Rate:      Patient Active Problem List   Diagnosis Date Noted   Parkinson's disease (HCC) 06/06/2024   Essential hypertension 06/06/2024   Class 1 obesity 06/06/2024   Concern for stroke 06/06/2024   Closed left acetabular  fracture (HCC) 06/05/2024    Palliative Care Assessment & Plan   Patient Profile: 83 y.o. male  with past medical history of HTN, Parkinson's disease admitted on 06/05/2024 with fall and left hip pain.    In  the ED, CT in the ER showed acetabular fracture and small hematoma. Family has declined surgical intervention after discussion with orthopedics.    PMT has been consulted to assist with goals of care conversation.  Assessment: Patient sitting up in bed watching television. He is in NAD. He does not report pain. When I ask how he is doing today he responds do good. I told him I would be calling his son Kevin Valentine and he responded okay.  11:00 am: Spoke to patient's son Kevin Valentine by phone. He saw his father this morning and shared that he is at baseline and very close to his normal. At baseline he has limited speech but is able to have conversations and asked his son this morning how long he will be here. Kevin Valentine shared that his family continues to work on cleaning out the patient's home so they can sell it to pay for long-term care needs. Prior to the fall the patient and his family had discussed the plan to eventually transition out of the house into a place with more assistance as his disease progressed. The fall sped up the need to complete this.  We discussed the MOST form.  Kevin Valentine has not had the opportunity to review this with his children yet.  They plan on reviewing this this afternoon and leaving a copy where they have made the selections in the room.  Tomorrow we will review the selections by phone and then make the form official.  Encouraged Kevin Valentine to call if he and his children have questions as they reviewing the form again.  PMT will continue to support.  Recommendations/Plan: Continue DNR/DNI Goals are clear for SNF placement and outpatient palliative care follow-up; ultimate goal is to sell patient's home to pay for long-term care needs.  Anticipate transition to hospice once  arrangements can be made for long-term care Patient's family would like to complete MOST form over the weekend PMT to continue to follow and support   Code Status:    Code Status Orders  (From admission, onward)           Start     Ordered   06/06/24 1628  Do not attempt resuscitation (DNR)- Limited -Do Not Intubate (DNI)  (Code Status)  Continuous       Question Answer Comment  If pulseless and not breathing No CPR or chest compressions.   In Pre-Arrest Conditions (Patient Is Breathing and Has A Pulse) Do not intubate. Provide all appropriate non-invasive medical interventions. Avoid ICU transfer unless indicated or required.   Consent: Discussion documented in EHR or advanced directives reviewed      06/06/24 1627         Extensive chart review has been completed prior to seeing the patient including labs, vital signs, imaging, progress/consult notes, orders, medications, and available advance directive documents.  Care plan was discussed with Dr. Jonel  Time spent: 50 minutes  Thank you for allowing the Palliative Medicine Team to assist in the care of this patient.   Kevin CHRISTELLA Palin, NP  Please contact Palliative Medicine Team phone at [phone] for questions and concerns.

## 2024-06-08 NOTE — Progress Notes (Signed)
  Progress Note   Patient: Kevin Valentine FMW:[id number] DOB: Sep 05, 1941 DOA: 06/05/2024     3 DOS: the patient was seen and examined on 06/08/2024      Brief hospital course: 83 y.o. M with obesity, Parkinson's disease, HTN who presented with fall and left hip pain.  CT in the ER showed acetabular fracture and small hematoma.  Orthopedics was consulted.     Assessment and Plan: * Closed left acetabular fracture [facility]) Discussed with Orthopedics.   Benefits of surgical management are not clear, potential for difficult rehab, poor recovery. Given overall GOC, will defer surgery for now. - [facility] LLE - Analgesics - Appreciate orthopedics expertise    Concern for stroke There was some initial concern for gaze deviation, which was likely misinterpretation of his parkinsonian symptoms.  MRI was obtained to rule this out, and although low quality, no stroke was seen.  He currently has no focal neurological deficits.   Class 1 obesity BMI 31, complicates care  Essential hypertension Blood pressure normal - Continue amlodipine , atenolol   Parkinson's disease (HCC) - Continue Sinemet           Subjective: More alert today, answers questions appropriately.       Physical Exam: BP 111/69 (BP Location: Right Arm)   Pulse 67   Temp 98 F (36.7 C) (Oral)   Resp 17   SpO2 94%   Elderly adult male, tired, lying in bed.  Wakes appropriately, answers questions.   RRR, no murmurs, no peripheral edema Respiratory rate normal, lungs clear without rales or wheezes, lung sounds diminished, poor respiratory effort, abdomen soft, no tenderness palpation, no ascites or distention Oriented to situation, bradykinesia noted.        Data Reviewed:    Family Communication:      Disposition: Status is: Inpatient         Author: Lonni SHAUNNA Dalton, MD 06/08/2024 4:47 PM  For on call review [email or web address].

## 2024-06-08 NOTE — Evaluation (Addendum)
 Speech Language Pathology Evaluation Patient Details Name: Kevin Valentine MRN: [id number] DOB: [birth year] Today's Date: 06/08/2024 Time: 8891-8880 SLP Time Calculation (min) (ACUTE ONLY): 11 min  Problem List:  Patient Active Problem List   Diagnosis Date Noted   Parkinson's disease (HCC) 06/06/2024   Essential hypertension 06/06/2024   Class 1 obesity 06/06/2024   Concern for stroke 06/06/2024   Closed left acetabular fracture (HCC) 06/05/2024   Past Medical History:  Past Medical History:  Diagnosis Date   Corticobasal syndrome (HCC)    Past Surgical History:  Past Surgical History:  Procedure Laterality Date   ANKLE FUSION Right    CHOLECYSTECTOMY N/A 06/26/2017   Procedure: LAPAROSCOPIC CHOLECYSTECTOMY;  Surgeon: Shelva Dunnings, MD;  Location: [facility] ORS;  Service: General;  Laterality: N/A;   HERNIA REPAIR     HPI:  Kevin Valentine is a 83 y.o. male with medical history significant for dementia, Parkinson's who presented from home due to mechanical fall.  Patient's son stated that he fell on his left side and hurt his left elbow.  He has been having some worsening confusion and concern for L neglect since this episode.   MRI brain showed no acute intracranial abnormalities.  CT left hip showed left acetabular fracture with hematoma.  CTA head and neck showed no acute abnormalities.  X-ray of elbow showed no fracture, x-ray of ankle showed no fracture.  X-ray of femur showed hip fracture.   Assessment / Plan / Recommendation Clinical Impression  Pt presents with cognitive deterioration with primary deficits in the areas of working memory, awareness, reasoning, and higher-level attention. He demonstrated sustained attention sufficient to complete tasks over 60 second time span.  He recalled 3/5 objects after 5 minute delay. Tasks requiring working memory to manipulate numbers/letters were more challenging. He was oriented to some elements of person and year, location He was  able to respond to questions about his personal hx/career and social communication was intact.  Affect was flat with hypomimia. Verbal reasoning and intellectual awareness were impaired. No SLP intervention for cognition is needed in current setting. He may benefit from SLP assessment at next level of care if doing so is c/w goals of care.  SLP following for dysphagia while admitted at [facility].    SLP Assessment  SLP Recommendation/Assessment: Patient does not need any further Speech Language Pathology Services SLP Visit Diagnosis: Cognitive communication deficit (R41.841)     Assistance Recommended at Discharge  Frequent or constant Supervision/Assistance                 SLP Evaluation Cognition  Overall Cognitive Status: Impaired/Different from baseline Arousal/Alertness: Awake/alert Orientation Level: Oriented to person;Oriented to place;Disoriented to time Attention: Sustained Sustained Attention: Impaired Sustained Attention Impairment: Verbal basic;Functional basic Memory: Impaired Memory Impairment: Storage deficit Awareness: Impaired Awareness Impairment: Intellectual impairment Executive Function: Self Monitoring Self Monitoring: Impaired Self Monitoring Impairment: Verbal basic;Functional basic Safety/Judgment: Impaired       Comprehension  Auditory Comprehension Overall Auditory Comprehension: Appears within functional limits for tasks assessed    Expression Expression Primary Mode of Expression: Verbal Verbal Expression Overall Verbal Expression: Appears within functional limits for tasks assessed Written Expression Dominant Hand: Right Written Expression: Not tested (tremor)   Oral / Motor  Oral Motor/Sensory Function Overall Oral Motor/Sensory Function: Within functional limits Motor Speech Overall Motor Speech: Appears within functional limits for tasks assessed            [name] 06/08/2024, 11:33 AM Palma L. Vona, [place]  CCC/SLP Clinical  Specialist - Acute Care SLP Acute Rehabilitation Services Office number [phone]

## 2024-06-09 DIAGNOSIS — Z515 Encounter for palliative care: Secondary | ICD-10-CM | POA: Diagnosis not present

## 2024-06-09 DIAGNOSIS — Z7189 Other specified counseling: Secondary | ICD-10-CM | POA: Diagnosis not present

## 2024-06-09 DIAGNOSIS — S32402A Unspecified fracture of left acetabulum, initial encounter for closed fracture: Secondary | ICD-10-CM | POA: Diagnosis not present

## 2024-06-09 NOTE — Progress Notes (Signed)
  Progress Note   Patient: Kevin Valentine FMW:[id number] DOB: 05/02/1941 DOA: 06/05/2024     4 DOS: the patient was seen and examined on 06/09/2024 at 9:36AM      Brief hospital course: 83 y.o. M with obesity, Parkinson's disease, HTN who presented with fall and left hip pain.  CT in the ER showed acetabular fracture and small hematoma.  Orthopedics was consulted.     Assessment and Plan: * Closed left acetabular fracture (HCC) -Touchdown weightbearing - SNF    Stroke ruled out    Class 1 obesity BMI 31, complicates care  Essential hypertension Blood pressure normal - Continue amlodipine , atenolol   Parkinson's disease (HCC) - Continue Sinemet           Subjective: Patient has no complaints.  His hip is sore.  He has had no fever, respiratory distress, vomiting.     Physical Exam: BP 122/70 (BP Location: Right Arm)   Pulse 69   Temp 98 F (36.7 C) (Oral)   Resp 15   SpO2 94%   Elderly adult male, lying in bed, wakes, answers questions, face masked, cogwheeling, resting tremor, bradykinesia RRR, no murmurs, no peripheral edema Respiratory rate normal, lungs clear without wheezing or rales, overall diminished      Disposition: Status is: Inpatient         Author: Lonni SHAUNNA Dalton, MD 06/09/2024 3:59 PM  For on call review [email or web address].

## 2024-06-09 NOTE — Progress Notes (Signed)
 Daily Progress Note   Patient Name: Kevin Valentine       Date: 06/09/2024 DOB: 1941/10/30  Age: 83 y.o. MRN#: [id number] Attending Physician: Jonel Lonni SQUIBB, * Primary Care Physician: Jyl Railing, MD Admit Date: 06/05/2024  Reason for Consultation/Follow-up: Establishing goals of care  Length of Stay: 4  Current Medications: Scheduled Meds:   allopurinol   300 mg Oral Daily   amLODipine   5 mg Oral Daily   aspirin  EC  81 mg Oral Daily   atenolol   50 mg Oral Daily   carbidopa -levodopa   2 tablet Oral TID   enoxaparin  (LOVENOX ) injection  40 mg Subcutaneous Q24H   feeding supplement  237 mL Oral BID BM   traZODone   100 mg Oral QHS    Continuous Infusions:   PRN Meds: acetaminophen  **OR** acetaminophen  (TYLENOL ) oral liquid 160 mg/5 mL **OR** acetaminophen   Physical Exam Vitals reviewed.  Constitutional:      General: He is sleeping. He is not in acute distress.    Appearance: He is not ill-appearing.  HENT:     Head: Normocephalic and atraumatic.   Cardiovascular:     Rate and Rhythm: Normal rate.  Pulmonary:     Effort: Pulmonary effort is normal.             Vital Signs: BP 131/72 (BP Location: Right Arm)   Pulse 61   Temp 97.9 F (36.6 C) (Oral)   Resp 15   SpO2 97%  SpO2: SpO2: 97 % O2 Device: O2 Device: Room Air O2 Flow Rate:      Patient Active Problem List   Diagnosis Date Noted   Parkinson's disease (HCC) 06/06/2024   Essential hypertension 06/06/2024   Class 1 obesity 06/06/2024   Concern for stroke 06/06/2024   Closed left acetabular fracture (HCC) 06/05/2024    Palliative Care Assessment & Plan   Patient Profile: 83 y.o. male  with past medical history of HTN, Parkinson's disease admitted on 06/05/2024 with fall and left hip  pain.    In the ED, CT in the ER showed acetabular fracture and small hematoma. Family has declined surgical intervention after discussion with orthopedics.    PMT has been consulted to assist with goals of care conversation.  Assessment: Patient sleeping. He appears comfortable and in NAD. No family at bedside.  9:45  am: Spoke to patient's son Ozell. MOST form completed today. The patient's son Ozell spoke with the patient and family yesterday and outlined their wishes for the following treatment decisions:  Cardiopulmonary Resuscitation: Do Not Attempt Resuscitation (DNR/No CPR)  Medical Interventions: Limited Additional Interventions: Use medical treatment, IV fluids and cardiac monitoring as indicated, DO NOT USE intubation or mechanical ventilation. May consider use of less invasive airway support such as BiPAP or CPAP. Also provide comfort measures. Transfer to the hospital if indicated. Avoid intensive care.   Antibiotics: Determine use of limitation of antibiotics when infection occurs  IV Fluids: No IV fluids (provide other measures to ensure comfort)  Feeding Tube: No feeding tube    Goals are clear at this time. PMT will support peripherally.  Recommendations/Plan: Continue DNR/DNI MOST completed Goals are clear for SNF placement and outpatient palliative care follow-up; ultimate goal is to sell patient's home to pay for long-term care needs.  Anticipate transition to hospice once arrangements can be made for long-term care PMT to support peripherally   Code Status:    Code Status Orders  (From admission, onward)           Start     Ordered   06/06/24 1628  Do not attempt resuscitation (DNR)- Limited -Do Not Intubate (DNI)  (Code Status)  Continuous       Question Answer Comment  If pulseless and not breathing No CPR or chest compressions.   In Pre-Arrest Conditions (Patient Is Breathing and Has A Pulse) Do not intubate. Provide all appropriate non-invasive  medical interventions. Avoid ICU transfer unless indicated or required.   Consent: Discussion documented in EHR or advanced directives reviewed      06/06/24 1627         Extensive chart review has been completed prior to seeing the patient including labs, vital signs, imaging, progress/consult notes, orders, medications, and available advance directive documents.  Care plan was discussed with Dr. Jonel  Time spent: 25 minutes  Thank you for allowing the Palliative Medicine Team to assist in the care of this patient.   Stephane CHRISTELLA Palin, NP  Please contact Palliative Medicine Team phone at [phone] for questions and concerns.

## 2024-06-09 NOTE — Plan of Care (Signed)
  Problem: Education: Goal: Knowledge of disease or condition will improve Outcome: Progressing Goal: Knowledge of secondary prevention will improve (MUST DOCUMENT ALL) Outcome: Progressing Goal: Knowledge of patient specific risk factors will improve (DELETE if not current risk factor) Outcome: Progressing   Problem: Ischemic Stroke/TIA Tissue Perfusion: Goal: Complications of ischemic stroke/TIA will be minimized Outcome: Progressing   Problem: Coping: Goal: Will identify appropriate support needs Outcome: Progressing   Problem: Health Behavior/Discharge Planning: Goal: Ability to manage health-related needs will improve Outcome: Progressing Goal: Goals will be collaboratively established with patient/family Outcome: Progressing   Problem: Self-Care: Goal: Ability to participate in self-care as condition permits will improve Outcome: Progressing Goal: Verbalization of feelings and concerns over difficulty with self-care will improve Outcome: Progressing Goal: Ability to communicate needs accurately will improve Outcome: Progressing   Problem: Nutrition: Goal: Risk of aspiration will decrease Outcome: Progressing Goal: Dietary intake will improve Outcome: Progressing   Problem: Education: Goal: Knowledge of General Education information will improve Description: Including pain rating scale, medication(s)/side effects and non-pharmacologic comfort measures Outcome: Progressing   Problem: Health Behavior/Discharge Planning: Goal: Ability to manage health-related needs will improve Outcome: Progressing   Problem: Clinical Measurements: Goal: Ability to maintain clinical measurements within normal limits will improve Outcome: Progressing Goal: Will remain free from infection Outcome: Progressing Goal: Diagnostic test results will improve Outcome: Progressing Goal: Respiratory complications will improve Outcome: Progressing Goal: Cardiovascular complication will be  avoided Outcome: Progressing   Problem: Activity: Goal: Risk for activity intolerance will decrease Outcome: Progressing   Problem: Nutrition: Goal: Adequate nutrition will be maintained Outcome: Progressing   Problem: Coping: Goal: Level of anxiety will decrease Outcome: Progressing   Problem: Elimination: Goal: Will not experience complications related to bowel motility Outcome: Progressing Goal: Will not experience complications related to urinary retention Outcome: Progressing   Problem: Pain Managment: Goal: General experience of comfort will improve and/or be controlled Outcome: Progressing   Problem: Safety: Goal: Ability to remain free from injury will improve Outcome: Progressing   Problem: Skin Integrity: Goal: Risk for impaired skin integrity will decrease Outcome: Progressing

## 2024-06-09 NOTE — Plan of Care (Signed)
  Problem: Nutrition: Goal: Risk of aspiration will decrease Outcome: Progressing Goal: Dietary intake will improve Outcome: Progressing   

## 2024-06-10 DIAGNOSIS — S32402A Unspecified fracture of left acetabulum, initial encounter for closed fracture: Secondary | ICD-10-CM | POA: Diagnosis not present

## 2024-06-10 MED ORDER — CARBIDOPA-LEVODOPA 25-100 MG PO TABS
2.0000 | ORAL_TABLET | Freq: Three times a day (TID) | ORAL | Status: AC
Start: 2024-06-10 — End: ?

## 2024-06-10 MED ORDER — ENSURE PLUS HIGH PROTEIN PO LIQD
237.0000 mL | Freq: Two times a day (BID) | ORAL | Status: AC
Start: 2024-06-10 — End: ?

## 2024-06-10 MED ORDER — ASPIRIN 81 MG PO TBEC
81.0000 mg | DELAYED_RELEASE_TABLET | Freq: Every day | ORAL | Status: AC
Start: 2024-06-11 — End: ?

## 2024-06-10 NOTE — Discharge Summary (Signed)
 Physician Discharge Summary   Patient: Kevin Valentine MRN: [id number] DOB: 1941-06-21  Admit date:     06/05/2024  Discharge date: 06/10/24  Discharge Physician: Lonni SHAUNNA Dalton   PCP: Jyl Railing, MD     Recommendations at discharge:  Weight bearing as tolerated on left lower extremity Consider utilizing lift for bed->stand transfers Continue PT Follow up with Orthopedics Dr. Kendal in 1-2 weeks Recommend dysphagia diet with chopped consistency, nectar thick liquid Recommend palliative care to follow at Skilled Nursing     Discharge Diagnoses: Principal Problem:   Closed left acetabular fracture [facility]) Active Problems:   Parkinson's disease (HCC)   Essential hypertension   Class 1 obesity   Stroke ruled out      Hospital Course: 83 y.o. M with obesity, Parkinson's disease, HTN who presented with fall and left hip pain.  CT in the ER showed acetabular fracture and small hematoma.  Orthopedics was consulted.      * Closed left acetabular fracture (HCC) Evaluated by Orthopedics.   Benefits of surgical management are not clear, potential for difficult rehab, poor recovery.   Goals of care were discussed and decision was made to defer surgery for now.  Patient was not able to cognitively follow touch down weight bearing restrictions, and so this was discussed with surgery, and we recommend WEIGHT BEARING AS TOLERATED, continued mobilization, follow up with Orthopedics.       Concern for stroke, stroke ruled out There was some initial concern for gaze deviation, which was likely misinterpretation of his parkinsonian symptoms.  MRI was obtained to rule this out, and no stroke was seen.    Class 1 obesity BMI 31, complicates care  Essential hypertension Blood pressure normal on amlodipine , atenolol    Parkinson's disease (HCC) Stable on Sinemet  at 8:30AM, 1PM and 8PM            The [place]  Controlled Substances Registry was  reviewed for this patient prior to discharge.  Consultants: Orthopedics, Dr. Kendal   Disposition: Skilled nursing facility Diet recommendation:  Regular diet  DISCHARGE MEDICATION: Allergies as of 06/10/2024   No Known Allergies      Medication List     TAKE these medications    acetaminophen  325 MG tablet Commonly known as: Tylenol  Take 2 tablets (650 mg total) by mouth every 6 (six) hours as needed.   allopurinol  300 MG tablet Commonly known as: ZYLOPRIM  Take 300 mg by mouth daily with lunch.   amLODipine  5 MG tablet Commonly known as: NORVASC  Take 5 mg by mouth daily with breakfast.   aspirin  EC 81 MG tablet Take 1 tablet (81 mg total) by mouth daily. Swallow whole. Start taking on: June 11, 2024 What changed:  additional instructions Another medication with the same name was removed. Continue taking this medication, and follow the directions you see here.   atenolol  50 MG tablet Commonly known as: TENORMIN  Take 50 mg by mouth daily with breakfast.   carbidopa -levodopa  25-100 MG tablet Commonly known as: SINEMET  IR Take 2 tablets by mouth 3 (three) times daily with meals. At 8:30AM, 1PM and 8PM What changed: additional instructions   colchicine 0.6 MG tablet Take 0.6 mg by mouth 2 (two) times daily as needed (as directed for acute gout flares- for three to four days).   feeding supplement Liqd Take 237 mLs by mouth 2 (two) times daily between meals.   ibuprofen  600 MG tablet Commonly known as: ADVIL  Take 1 tablet (600 mg total) by mouth  every 6 (six) hours as needed.   ondansetron  4 MG disintegrating tablet Commonly known as: ZOFRAN -ODT Take 1 tablet (4 mg total) by mouth every 6 (six) hours as needed for nausea or vomiting. What changed: reasons to take this   PriLOSEC OTC 20 MG tablet Generic drug: omeprazole Take 20 mg by mouth daily as needed (for reflux).   THERAGRAN PO Take 1 tablet by mouth daily with breakfast.   traZODone  50 MG  tablet Commonly known as: DESYREL  Take 50 mg by mouth at bedtime.        Contact information for follow-up providers     Haddix, Franky SQUIBB, MD. Schedule an appointment as soon as possible for a visit in 1 week(s).   Specialty: Orthopedic Surgery Contact information: [address] [place] [address] [phone]              Contact information for after-discharge care     Destination     [facility] [place], [place] .   Service: Skilled Nursing Contact information: [address] [place]  [address] [phone]                     Discharge Instructions     Increase activity slowly   Complete by: As directed        Discharge Exam: There were no vitals filed for this visit.  General: Pt is alert, awake, not in acute distress, lying in bed Cardiovascular: RRR, nl S1-S2, no murmurs appreciated.   No LE edema.   Respiratory: Normal respiratory rate and rhythm.  CTAB without rales or wheezes. Abdominal: Abdomen soft and non-tender.  No distension or HSM.   Neuro/Psych: Bradykinesia noted, resting tremor.  Halting speech.  Generalized weakness, leg strength not tested.      Condition at discharge: fair  The results of significant diagnostics from this hospitalization (including imaging, microbiology, ancillary and laboratory) are listed below for reference.   Imaging Studies: DG Swallowing Func-Speech Pathology Result Date: 06/06/2024 Table formatting from the original result was not included. Modified Barium Swallow Study Patient Details Name: Kevin Valentine MRN: [id number] Date of Birth: August 11, 1941 Today's Date: 06/06/2024 HPI/PMH: HPI: Kevin Valentine is a 83 y.o. male with medical history significant for dementia, Parkinson's who presented from home due to mechanical fall.  Patient's son stated that he fell on his left side and hurt his left elbow.  He has been having some worsening confusion and concern for L neglect  since this episode.   MRI brain showed no acute intracranial abnormalities.  CT left hip showed left acetabular fracture with hematoma.  CTA head and neck showed no acute abnormalities.  X-ray of elbow showed no fracture, x-ray of ankle showed no fracture.  X-ray of femur showed hip fracture. Clinical Impression: Pt presents with a moderate pharyngeal dysphagia per the DIGEST (see below) and a mild oral dysphagia.  The muscles of the pharynx and larynx worked with full ROM and adequate strength.  There was excellent pharyngeal clearance (no residuals).  Aspiration of thin liquids occurred on two occasions when liquids spilled into the larynx before laryngeal vestibule closure occurred.  Airway closure was timely for all other swallows. No esophageal residuals noted after taking pill whole with nectar liquids.  Given current mental status, recommend initiating a dysphagia 3 diet with nectar thick liquids.  SLP to follow and will work to advance liquids to thin during admission. DIGEST Swallow Severity Rating*  Safety: 2  Efficiency: 0  Overall Pharyngeal Swallow Severity: 2- moderate 1: mild; 2: moderate; 3: severe; 4: profound *The Dynamic Imaging Grade of Swallowing Toxicity is standardized for the head and neck cancer population, however, demonstrates promising clinical applications across populations to standardize the clinical rating of pharyngeal swallow safety and severity. Factors that may increase risk of adverse event in presence of aspiration Noe & Lianne 2021): Factors that may increase risk of adverse event in presence of aspiration Noe & Lianne 2021): Limited mobility; Reduced cognitive function Recommendations/Plan: Swallowing Evaluation Recommendations Swallowing Evaluation Recommendations Recommendations: PO diet PO Diet Recommendation: Dysphagia 3 (Mechanical soft); Mildly thick liquids (Level 2, nectar thick) Liquid Administration via: Cup; Straw Medication Administration: Whole meds with  liquid Supervision: Staff to assist with self-feeding Oral care recommendations: Oral care BID (2x/day) Treatment Plan Treatment Plan Treatment recommendations: Therapy as outlined in treatment plan below Follow-up recommendations: Other (comment) Recommendations Comment: tba Functional status assessment: Patient has had a recent decline in their functional status and demonstrates the ability to make significant improvements in function in a reasonable and predictable amount of time. Treatment frequency: Min 2x/week Treatment duration: 1 week Interventions: Trials of upgraded texture/liquids; Patient/family education; Diet toleration management by SLP Recommendations Recommendations for follow up therapy are one component of a multi-disciplinary discharge planning process, led by the attending physician.  Recommendations may be updated based on patient status, additional functional criteria and insurance authorization. Assessment: Orofacial Exam: Orofacial Exam Oral Cavity: Oral Hygiene: WFL Oral Cavity - Dentition: Adequate natural dentition; Missing dentition Orofacial Anatomy: WFL Oral Motor/Sensory Function: WFL Anatomy: Anatomy: Prominent cricopharyngeus Boluses Administered: Boluses Administered Boluses Administered: Thin liquids (Level 0); Mildly thick liquids (Level 2, nectar thick); Moderately thick liquids (Level 3, honey thick); Puree; Solid  Oral Impairment Domain: Oral Impairment Domain Lip Closure: No labial escape Tongue control during bolus hold: Not tested Bolus preparation/mastication: Slow prolonged chewing/mashing with complete recollection Bolus transport/lingual motion: Repetitive/disorganized tongue motion  Pharyngeal Impairment Domain: Pharyngeal Impairment Domain Soft palate elevation: No bolus between soft palate (SP)/pharyngeal wall (PW) Laryngeal elevation: Complete superior movement of thyroid cartilage with complete approximation of arytenoids to epiglottic petiole Anterior hyoid  excursion: Complete anterior movement Epiglottic movement: Complete inversion Laryngeal vestibule closure: Complete, no air/contrast in laryngeal vestibule Pharyngeal stripping wave : Present - complete Pharyngeal contraction (A/P view only): N/A Pharyngoesophageal segment opening: Partial distention/partial duration, partial obstruction of flow Tongue base retraction: No contrast between tongue base and posterior pharyngeal wall (PPW) Pharyngeal residue: Complete pharyngeal clearance Location of pharyngeal residue: N/A  Esophageal Impairment Domain: Esophageal Impairment Domain Esophageal clearance upright position: Complete clearance, esophageal coating Pill: Pill Consistency administered: Mildly thick liquids (Level 2, nectar thick) Penetration/Aspiration Scale Score: Penetration/Aspiration Scale Score 1.  Material does not enter airway: Mildly thick liquids (Level 2, nectar thick); Moderately thick liquids (Level 3, honey thick); Puree; Solid; Pill 7.  Material enters airway, passes BELOW cords and not ejected out despite cough attempt by patient: Thin liquids (Level 0) Compensatory Strategies: Compensatory Strategies Compensatory strategies: Yes Straw: Ineffective (aspiration when using straw; delayed cough) Ineffective Straw: Thin liquid (Level 0)   General Information: Caregiver present: No  Diet Prior to this Study: NPO   Temperature : Normal   Respiratory Status: WFL   Supplemental O2: None (Room air)   History of Recent Intubation: No  Behavior/Cognition: Alert; Confused Self-Feeding Abilities: Needs assist with self-feeding Baseline vocal quality/speech: Normal Volitional Cough: Unable to elicit Volitional Swallow: Unable to elicit Exam Limitations: No limitations Goal Planning: Prognosis for improved oropharyngeal function:  Good Barriers to Reach Goals: Cognitive deficits No data recorded Patient/Family Stated Goal: none stated Consulted and agree with results and recommendations: Pt unable/family or  caregiver not available Pain: Pain Assessment Pain Assessment: Faces Faces Pain Scale: 2 Pain Intervention(s): Limited activity within patient's tolerance End of Session: Start Time:SLP Start Time (ACUTE ONLY): 1301 Stop Time: SLP Stop Time (ACUTE ONLY): 1320 Time Calculation:SLP Time Calculation (min) (ACUTE ONLY): 19 min Charges: SLP Evaluations $ SLP Speech Visit: 1 Visit SLP Evaluations $BSS Swallow: 1 Procedure $MBS Swallow: 1 Procedure SLP visit diagnosis: SLP Visit Diagnosis: Dysphagia, oropharyngeal phase (R13.12) Past Medical History: Past Medical History: Diagnosis Date  Corticobasal syndrome (HCC)  Past Surgical History: Past Surgical History: Procedure Laterality Date  ANKLE FUSION Right   CHOLECYSTECTOMY N/A 06/26/2017  Procedure: LAPAROSCOPIC CHOLECYSTECTOMY;  Surgeon: Shelva Dunnings, MD;  Location: [facility] ORS;  Service: General;  Laterality: N/A;  HERNIA REPAIR   [name] 06/06/2024, 2:01 PM  ECHOCARDIOGRAM COMPLETE Result Date: 06/06/2024    ECHOCARDIOGRAM REPORT   Patient Name:   Kevin Valentine Date of Exam: 06/06/2024 Medical Rec #:  [id number]          Height:       72.0 in Accession #:    [id number]         Weight:       224.0 lb Date of Birth:  1941/04/14          BSA:          2.236 m Patient Age:    82 years           BP:           116/72 mmHg Patient Gender: M                  HR:           75 bpm. Exam Location:  Inpatient Procedure: 2D Echo, Color Doppler and Cardiac Doppler (Both Spectral and Color            Flow Doppler were utilized during procedure). Indications:     Stroke  History:         Patient has no prior history of Echocardiogram examinations.  Sonographer:     Eva Lash Referring Phys:  [id number] ROBERT DORRELL Diagnosing Phys: Salena Negri MD IMPRESSIONS  1. Left ventricular ejection fraction, by estimation, is 60 to 65%. The left ventricle has normal function. The left ventricle has no regional wall motion abnormalities. Left ventricular diastolic parameters  are consistent with Grade I diastolic dysfunction (impaired relaxation).  2. Right ventricular systolic function is normal. The right ventricular size is normal.  3. The mitral valve is normal in structure. No evidence of mitral valve regurgitation. No evidence of mitral stenosis.  4. The aortic valve is normal in structure. Aortic valve regurgitation is not visualized. No aortic stenosis is present.  5. The inferior vena cava is normal in size with greater than 50% respiratory variability, suggesting right atrial pressure of 3 mmHg. FINDINGS  Left Ventricle: Left ventricular ejection fraction, by estimation, is 60 to 65%. The left ventricle has normal function. The left ventricle has no regional wall motion abnormalities. Definity  contrast agent was given IV to delineate the left ventricular  endocardial borders. The left ventricular internal cavity size was normal in size. There is no left ventricular hypertrophy. Left ventricular diastolic parameters are consistent with Grade I diastolic dysfunction (impaired relaxation). Right Ventricle: The right ventricular size is normal. No increase in  right ventricular wall thickness. Right ventricular systolic function is normal. Left Atrium: Left atrial size was normal in size. Right Atrium: Right atrial size was normal in size. Pericardium: There is no evidence of pericardial effusion. Mitral Valve: The mitral valve is normal in structure. No evidence of mitral valve regurgitation. No evidence of mitral valve stenosis. Tricuspid Valve: The tricuspid valve is normal in structure. Tricuspid valve regurgitation is not demonstrated. No evidence of tricuspid stenosis. Aortic Valve: The aortic valve is normal in structure. Aortic valve regurgitation is not visualized. No aortic stenosis is present. Aortic valve mean gradient measures 2.0 mmHg. Aortic valve peak gradient measures 5.1 mmHg. Aortic valve area, by VTI measures 2.88 cm. Pulmonic Valve: The pulmonic valve was  normal in structure. Pulmonic valve regurgitation is not visualized. No evidence of pulmonic stenosis. Aorta: The aortic root is normal in size and structure. Venous: The inferior vena cava is normal in size with greater than 50% respiratory variability, suggesting right atrial pressure of 3 mmHg. IAS/Shunts: No atrial level shunt detected by color flow Doppler.  LEFT VENTRICLE PLAX 2D LVIDd:         4.00 cm      Diastology LVIDs:         3.10 cm      LV e' medial:    5.97 cm/s LV PW:         1.30 cm      LV E/e' medial:  7.7 LV IVS:        1.50 cm      LV e' lateral:   7.46 cm/s LVOT diam:     2.00 cm      LV E/e' lateral: 6.2 LV SV:         56 LV SV Index:   25 LVOT Area:     3.14 cm  LV Volumes (MOD) LV vol d, MOD A2C: 73.8 ml LV vol d, MOD A4C: 122.0 ml LV vol s, MOD A2C: 21.5 ml LV vol s, MOD A4C: 39.5 ml LV SV MOD A2C:     52.3 ml LV SV MOD A4C:     122.0 ml LV SV MOD BP:      71.4 ml RIGHT VENTRICLE RV S prime:     14.30 cm/s TAPSE (M-mode): 2.0 cm LEFT ATRIUM             Index LA Vol (A2C):   39.6 ml 17.71 ml/m LA Vol (A4C):   37.7 ml 16.86 ml/m LA Biplane Vol: 39.5 ml 17.67 ml/m  AORTIC VALVE AV Area (Vmax):    2.59 cm AV Area (Vmean):   2.60 cm AV Area (VTI):     2.88 cm AV Vmax:           113.00 cm/s AV Vmean:          72.500 cm/s AV VTI:            0.193 m AV Peak Grad:      5.1 mmHg AV Mean Grad:      2.0 mmHg LVOT Vmax:         93.30 cm/s LVOT Vmean:        59.900 cm/s LVOT VTI:          0.177 m LVOT/AV VTI ratio: 0.92  AORTA Ao Asc diam: 3.60 cm MITRAL VALVE MV Area (PHT): 2.27 cm    SHUNTS MV Decel Time: 334 msec    Systemic VTI:  0.18 m MV E velocity: 46.00 cm/s  Systemic  Diam: 2.00 cm MV A velocity: 64.40 cm/s MV E/A ratio:  0.71 Salena Negri MD Electronically signed by Salena Negri MD Signature Date/Time: 06/06/2024/11:20:05 AM    Final    MR BRAIN WO CONTRAST Result Date: 06/05/2024 CLINICAL DATA:  Initial evaluation for acute neuro deficit, stroke suspected. EXAM: MRI HEAD WITHOUT  CONTRAST TECHNIQUE: Multiplanar, multiecho pulse sequences of the brain and surrounding structures were obtained without intravenous contrast. COMPARISON:  Comparison made with CT from earlier the same day. FINDINGS: Brain: Examination is technically limited as the patient was unable to tolerate the full length of the exam. Diffusion-weighted sequences, axial FLAIR and SWI sequences only were performed. Additionally, provided images are severely degraded by motion artifact. 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 No visible foci of restricted diffusion to suggest acute or subacute ischemia. No visible acute or chronic intracranial blood products. No visible mass lesion, mass effect or midline shift. Ventricular prominence related global parenchymal volume loss without obvious hydrocephalus. No visible extra-axial fluid collection. Vascular: Not well assessed on this limited exam. Skull and upper cervical spine: Not well assessed on this limited exam. Sinuses/Orbits: Not well assessed on this limited exam. Other: None. IMPRESSION: 1. Technically limited truncated and motion degraded exam. 2. No visible acute intracranial abnormality. 3. 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 Electronically Signed   By: Morene Hoard M.D.   On: 06/05/2024 19:14   CT Hip Left Wo Contrast Result Date: 06/05/2024 CLINICAL DATA:  Left acetabular fracture EXAM: CT OF THE LEFT HIP WITHOUT CONTRAST TECHNIQUE: Multidetector CT imaging of the left hip was performed according to the standard protocol. Multiplanar CT image reconstructions were also generated. RADIATION DOSE REDUCTION: This exam was performed according to the departmental dose-optimization program which includes automated exposure control, adjustment of the mA and/or kV according to patient size and/or use of iterative reconstruction technique. COMPARISON:  Radiographs 06/05/2024 FINDINGS:  Bones/Joint/Cartilage The left acetabular fracture has various components including a vertical component extending in the left iliac bone; a posterior component through the posterior wall; an anterior component through the anterior wall extending into the superior pubic ramus longitudinally; and a punched-out central component of the quadrilateral plate resulting in a posttraumatic mild protrusio appearance. A well-defined fracture of the inferior pubic ramus is not appreciated. No fracture component extending into the SI joint is observed. Mild left femoral head spurring. Ligaments Suboptimally assessed by CT. Muscles and Tendons Expansion and hematoma along the left obturator internus muscle as expected given the fracture of the quadrilateral plate. A small amount of hematoma also tracks along the left iliopsoas muscle in the lower pelvis. Edema/hematoma along the left pelvic sidewall. Soft tissues Substantial prostatomegaly, the prostate is only partially included on today's exam. No sciatic notch or sacral plexus impingement. No substantial impingement at the obturator foramen. IMPRESSION: 1. Complex left acetabular fracture with various components including a vertical component extending in the left iliac bone; a posterior component through the posterior wall; an anterior component through the anterior wall extending into the superior pubic ramus longitudinally; and a punched-out central component of the quadrilateral plate resulting in a posttraumatic mild protrusio appearance. No well-defined fracture of the inferior pubic ramus is identified. 2. Expansion and hematoma along the left obturator internus muscle as expected given the fracture of the quadrilateral plate. A small amount of hematoma also tracks along the left iliopsoas muscle in the lower pelvis. Edema/hematoma along the left pelvic sidewall. 3. Substantial prostatomegaly, the prostate is only  partially included on today's exam. Electronically  Signed   By: Ryan Salvage M.D.   On: 06/05/2024 15:37   CT Angio Head Neck W WO CM Result Date: 06/05/2024 CLINICAL DATA:  Head trauma, moderate-severe. Witnessed mechanical fall. Anisocoria. EXAM: CT ANGIOGRAPHY HEAD AND NECK WITH AND WITHOUT CONTRAST TECHNIQUE: Multidetector CT imaging of the head and neck was performed using the standard protocol during bolus administration of intravenous contrast. Multiplanar CT image reconstructions and MIPs were obtained to evaluate the vascular anatomy. Carotid stenosis measurements (when applicable) are obtained utilizing NASCET criteria, using the distal internal carotid diameter as the denominator. RADIATION DOSE REDUCTION: This exam was performed according to the departmental dose-optimization program which includes automated exposure control, adjustment of the mA and/or kV according to patient size and/or use of iterative reconstruction technique. CONTRAST:  75mL OMNIPAQUE  IOHEXOL  350 MG/ML SOLN COMPARISON:  Head CT 05/28/2024 and MRI 05/25/2021 FINDINGS: CT HEAD FINDINGS Brain: There is no evidence of an acute infarct, intracranial hemorrhage, mass, midline shift, or extra-axial fluid collection. Moderate cerebral atrophy is again noted. Cerebral white matter hypodensities are unchanged and nonspecific but compatible with mild chronic small vessel ischemic disease. Vascular: No hyperdense vessel. Skull: No acute fracture or suspicious lesion. Sinuses/Orbits: Paranasal sinuses and mastoid air cells are clear. Bilateral cataract extraction. Other: Topogram demonstrates a known left acetabular fracture. Review of the MIP images confirms the above findings CTA NECK FINDINGS Aortic arch: Standard branching with mild atherosclerosis. Widely patent brachiocephalic and subclavian arteries. Right carotid system: Patent without evidence of stenosis, dissection, or significant atherosclerosis. Left carotid system: Patent without evidence of stenosis, dissection, or  significant atherosclerosis. Vertebral arteries: Patent without evidence of stenosis, dissection, or significant atherosclerosis. Codominant. Skeleton: Spondylosis. Multiple Schmorl's nodes in the lower cervical and upper thoracic spine. No evidence of an acute fracture. Other neck: No evidence cervical lymphadenopathy or mass. Upper chest: No mass or consolidation in the lung apices. Review of the MIP images confirms the above findings CTA HEAD FINDINGS Anterior circulation: The internal carotid arteries are widely patent from skull base to carotid termini. ACAs and MCAs are patent without evidence of a proximal branch occlusion or significant proximal stenosis. No aneurysm is identified. Posterior circulation: The intracranial vertebral arteries are widely patent to the basilar. Patent left PICA, right AICA, and bilateral SCA origins are visualized. The basilar artery is widely patent. Posterior communicating arteries are diminutive or absent. Both PCAs are patent without evidence of a significant proximal stenosis. There is asymmetric attenuation of distal right PCA branch vessels. No aneurysm is identified. Venous sinuses: Not well evaluated due to arterial contrast timing. Anatomic variants: None. Review of the MIP images confirms the above findings IMPRESSION: 1. No evidence of acute intracranial abnormality. 2. 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 3. No large vessel occlusion, significant proximal stenosis, aneurysm, or dissection in the head or neck. 4.  Aortic Atherosclerosis (ICD10-I70.0). Electronically Signed   By: Dasie Hamburg M.D.   On: 06/05/2024 15:33   DG Elbow Complete Left Result Date: 06/05/2024 CLINICAL DATA:  Fall. EXAM: LEFT ELBOW - COMPLETE 3+ VIEW COMPARISON:  None Available. FINDINGS: There is no evidence of fracture, dislocation, or joint effusion. There is no evidence of arthropathy or other focal bone abnormality. Soft tissues are unremarkable.  IMPRESSION: Negative. Electronically Signed   By: Lynwood Landy Raddle M.D.   On: 06/05/2024 14:06   DG Femur 1V Left Result Date: 06/05/2024 CLINICAL DATA:  Fall. EXAM: LEFT FEMUR 1 VIEW  COMPARISON:  May 05, 2023. FINDINGS: Moderately displaced left acetabular fracture is noted medially and potentially posteriorly. The femur is unremarkable. IMPRESSION: Grossly normal left femur. Moderately displaced left acetabular fracture as noted above. Electronically Signed   By: Lynwood Landy Raddle M.D.   On: 06/05/2024 14:04   DG Hip Unilat With Pelvis 2-3 Views Left Result Date: 06/05/2024 CLINICAL DATA:  Fall. EXAM: DG HIP (WITH OR WITHOUT PELVIS) 2-3V LEFT COMPARISON:  Apr 16, 2023. FINDINGS: Moderately displaced left acetabular fracture is noted medially and posteriorly. Proximal left femur is unremarkable. IMPRESSION: Moderately displaced left acetabular fracture. CT scan is recommended for further evaluation. Electronically Signed   By: Lynwood Landy Raddle M.D.   On: 06/05/2024 14:03   DG Ankle Complete Left Result Date: 06/05/2024 CLINICAL DATA:  Left ankle pain after fall. EXAM: LEFT ANKLE COMPLETE - 3+ VIEW COMPARISON:  None Available. FINDINGS: There is no evidence of fracture, dislocation, or joint effusion. There is no evidence of arthropathy or other focal bone abnormality. Soft tissues are unremarkable. IMPRESSION: Negative. Electronically Signed   By: Lynwood Landy Raddle M.D.   On: 06/05/2024 14:02   DG Tibia/Fibula Left Result Date: 06/05/2024 CLINICAL DATA:  Fall. EXAM: LEFT TIBIA AND FIBULA - 2 VIEW COMPARISON:  Apr 16, 2023. FINDINGS: There is no evidence of fracture or other focal bone lesions. Soft tissues are unremarkable. IMPRESSION: Negative. Electronically Signed   By: Lynwood Landy Raddle M.D.   On: 06/05/2024 14:00   CT ABDOMEN PELVIS W CONTRAST Result Date: 05/28/2024 CLINICAL DATA:  Nausea vomiting dizzy EXAM: CT ABDOMEN AND PELVIS WITH CONTRAST TECHNIQUE: Multidetector CT imaging of the abdomen and  pelvis was performed using the standard protocol following bolus administration of intravenous contrast. RADIATION DOSE REDUCTION: This exam was performed according to the departmental dose-optimization program which includes automated exposure control, adjustment of the mA and/or kV according to patient size and/or use of iterative reconstruction technique. CONTRAST:  OMNIPAQUE  IOHEXOL  300 MG/ML  SOLN COMPARISON:  None Available. FINDINGS: Lower chest: Lung bases demonstrate no acute airspace disease. Mild bronchiectasis and scarring in the lower lobes. Hepatobiliary: No focal liver abnormality is seen. Status post cholecystectomy. No biliary dilatation. Pancreas: Unremarkable. No pancreatic ductal dilatation or surrounding inflammatory changes. Spleen: Normal in size without focal abnormality. Adrenals/Urinary Tract: Right adrenal gland is normal. 14 mm left adrenal nodule with density value of 31.8. Right kidney shows no hydronephrosis. Mild hydronephrosis of the upper pole of the left kidney. 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 Urinary bladder is unremarkable Stomach/Bowel: Stomach nonenlarged. No dilated small bowel. Negative appendix. No acute bowel wall thickening. Moderate stool in the colon. Vascular/Lymphatic: Aortic atherosclerosis. No enlarged abdominal or pelvic lymph nodes. Reproductive: Enlarged prostate Other: Negative for pelvic effusion or free air. Small fat containing left inguinal hernia Musculoskeletal: No acute or suspicious osseous abnormality. IMPRESSION: 1. No CT evidence for acute intra-abdominal or pelvic abnormality. 2. Mild hydronephrosis of the upper pole of the left kidney, question related to possible mass effect from prominent renal sinus cyst. 3. Enlarged prostate. 4. 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 Recommend follow-up adrenal washout CT in 1 year. If stable for greater than or equal to 1 year, no  further follow-up imaging. 5. Aortic atherosclerosis. Aortic Atherosclerosis (ICD10-I70.0). Electronically Signed   By: Luke Bun M.D.   On: 05/28/2024 18:53   CT Head Wo Contrast Result Date: 05/28/2024 CLINICAL DATA:  Head trauma vomiting EXAM:  CT HEAD WITHOUT CONTRAST TECHNIQUE: Contiguous axial images were obtained from the base of the skull through the vertex without intravenous contrast. RADIATION DOSE REDUCTION: This exam was performed according to the departmental dose-optimization program which includes automated exposure control, adjustment of the mA and/or kV according to patient size and/or use of iterative reconstruction technique. COMPARISON:  MRI 05/25/2021 FINDINGS: Brain: No acute territorial infarction, hemorrhage or intracranial mass. Moderate atrophy. Mild chronic small vessel ischemic changes of the white matter. Stable ventricle size. Vascular: No hyperdense vessels.  No unexpected calcification Skull: Normal. Negative for fracture or focal lesion. Sinuses/Orbits: No acute finding. Other: None IMPRESSION: 1. No CT evidence for acute intracranial abnormality. 2. Atrophy and chronic small vessel ischemic changes of the white matter. Electronically Signed   By: Luke Bun M.D.   On: 05/28/2024 18:00    Microbiology: Results for orders placed or performed during the hospital encounter of 06/26/17  Surgical PCR screen     Status: None   Collection Time: 06/26/17  9:52 AM   Specimen: Nasal Mucosa; Nasal Swab  Result Value Ref Range Status   MRSA, PCR NEGATIVE NEGATIVE Final   Staphylococcus aureus NEGATIVE NEGATIVE Final    Comment:        The Xpert SA Assay (FDA approved for NASAL specimens in patients over 67 years of age), is one component of a comprehensive surveillance program.  Test performance has been validated by [facility] for patients greater than or equal to 18 year old. It is not intended to diagnose infection nor to guide or monitor treatment.      Labs: CBC: Recent Labs  Lab 06/05/24 1338 06/07/24 0636  WBC 14.6* 8.3  NEUTROABS 12.3*  --   HGB 14.8 13.6  HCT 44.3 41.0  MCV 95.1 94.7  PLT 191 152   Basic Metabolic Panel: Recent Labs  Lab 06/05/24 1338 06/07/24 0636  NA 138 137  K 3.9 3.7  CL 104 105  CO2 25 21*  GLUCOSE 117* 104*  BUN 15 11  CREATININE 1.17 1.18  CALCIUM 9.0 8.6*   Liver Function Tests: No results for input(s): AST, ALT, ALKPHOS, BILITOT, PROT, ALBUMIN in the last 168 hours. CBG: No results for input(s): GLUCAP in the last 168 hours.  Discharge time spent: approximately 25 minutes spent on discharge counseling, evaluation of patient on day of discharge, and coordination of discharge planning with nursing, social work, pharmacy and case management  Signed: Lonni SHAUNNA Dalton, MD [facility] 06/10/2024

## 2024-06-10 NOTE — Plan of Care (Signed)
  Problem: Education: Goal: Knowledge of disease or condition will improve Outcome: Progressing   Problem: Ischemic Stroke/TIA Tissue Perfusion: Goal: Complications of ischemic stroke/TIA will be minimized Outcome: Progressing   Problem: Health Behavior/Discharge Planning: Goal: Goals will be collaboratively established with patient/family Outcome: Progressing   Problem: Education: Goal: Knowledge of General Education information will improve Description: Including pain rating scale, medication(s)/side effects and non-pharmacologic comfort measures Outcome: Progressing   Problem: Health Behavior/Discharge Planning: Goal: Ability to manage health-related needs will improve Outcome: Progressing   Problem: Activity: Goal: Risk for activity intolerance will decrease Outcome: Progressing   Problem: Pain Managment: Goal: General experience of comfort will improve and/or be controlled Outcome: Progressing

## 2024-06-10 NOTE — Progress Notes (Addendum)
 Speech Language Pathology Treatment: Dysphagia  Patient Details Name: Kevin Valentine MRN: [id number] DOB: Sep 21, 1941 Today's Date: 06/10/2024 Time: 1006-1019 SLP Time Calculation (min) (ACUTE ONLY): 13 min  Assessment / Plan / Recommendation Clinical Impression  Kevin Valentine was eating breakfast with help from RN upon entering room.  Assisted with Kevin rest of his meal.  He is tolerating nectar thick liquids, mechanical soft solids well with no indication of oral deficits nor aspiration. Trials of thin liquids offered -when consumed in single sips, there are no indications of aspiration. When taking sequential sips (more than one at a time), coughing occurs, consistent with findings on MBS and likely aspiration.  Recommend continuing dysphagia 3/nectar thick liquids upon D/C. Pt may have single sips of water between meals and thirty minutes after meals after oral care Kevin Valentine).*   *Kevin Valentine (FFWP) allows patients with dysphagia to drink plain water (neutral pH and free of bacteria) between meals, potentially improving hydration, condition of oral mucosa, preservation of swallowing musculature, and quality of life, while maintaining safety through strict oral hygiene and swallowing guidelines. Patients must be assessed by a speech-language pathologist (SLP) to determine if they are appropriate for Kevin Valentine. Water is allowed between meals and 30 minutes after meals, but not during meals and only after thorough oral care.    HPI HPI: Kevin Valentine is a 83 y.o. male with medical history significant for dementia, Parkinson's who presented from home due to mechanical fall.  Patient's son stated that he fell on his left side and hurt his left elbow.  He has been having some worsening confusion and concern for L neglect since this episode.   MRI brain showed no acute intracranial abnormalities.  CT left hip showed left acetabular fracture with hematoma.   CTA head and neck showed no acute abnormalities.  X-ray of elbow showed no fracture, x-ray of ankle showed no fracture.  X-ray of femur showed hip fracture.      SLP Plan  Continue with current plan of care          Recommendations  Diet recommendations: Dysphagia 3 (mechanical soft);Nectar-thick liquid;Other(comment) (may have single sips of thin water between meals); may have single sips of water per Kevin Valentine.  Liquids provided via: Cup;Straw Medication Administration: Whole meds with puree Supervision: Staff to assist with self feeding Compensations: Minimize environmental distractions                  Oral care BID   Frequent or constant Supervision/Assistance Dysphagia, oropharyngeal phase (R13.12)     Continue with current plan of care    Kevin Karel L. Vona, MA CCC/SLP Clinical Specialist - Acute Care SLP Acute Rehabilitation Services Office number [phone]  Kevin Valentine  06/10/2024, 10:22 AM

## 2024-06-10 NOTE — Progress Notes (Signed)
 Physical Therapy Treatment  Patient Details Name: Kevin Valentine MRN: [id number] DOB: May 23, 1941 Today's Date: 06/10/2024   History of Present Illness Pt is an 83 yo male presenting [facility] On 06/05/24 following a mechanical fall found to have L acetabular tx. Pt and family have deferred sx and pt TDWB on LLE. PMH of corticobasal syndrome, ankle fusion, cholecystectomy.    PT Comments  Pt progressing slowly towards physical therapy goals. Focus of session was transfer training, however pt unable to attempt lateral scoot transfers or standing transfers without breaking weight bearing precautions on the LLE. Heavy +2 assist required throughout session. With nursing staff, recommend pt transition bed<>chair with lift at this time. Will continue to follow and progress as able per POC.    If plan is discharge home, recommend the following: Assistance with cooking/housework;Direct supervision/assist for medications management;Assist for transportation;Help with stairs or ramp for entrance;Direct supervision/assist for financial management;Two people to help with walking and/or transfers;A lot of help with bathing/dressing/bathroom;Assistance with feeding;Supervision due to cognitive status   Can travel by private vehicle     No  Equipment Recommendations  BSC/3in1;Wheelchair (measurements PT);Wheelchair cushion (measurements PT);Hoyer lift    Recommendations for [employer]       Precautions / Restrictions Precautions Precautions: Fall Recall of Precautions/Restrictions: Impaired Precaution/Restrictions Comments: corticobasal syndrome Restrictions Weight Bearing Restrictions Per Provider Order: Yes LLE Weight Bearing Per Provider Order: Touchdown weight bearing     Mobility  Bed Mobility Overal bed mobility: Needs Assistance Bed Mobility: Rolling, Sidelying to Sit, Sit to Supine Rolling: Mod assist, +2 for physical assistance, +2 for safety/equipment, Used rails Sidelying to sit:  Mod assist, +2 for physical assistance, HOB elevated, Used rails   Sit to supine: Max assist, +2 for physical assistance, +2 for safety/equipment   General bed mobility comments: Assist for log roll technique to get EOB. +2 assist required throughout. For return to supine, +2 max assist with total assist with pad to scoot up towards HOB.    Transfers Overall transfer level: Needs assistance Equipment used: 2 person hand held assist Transfers: Sit to/from Stand Sit to Stand: Total assist, +2 physical assistance, +2 safety/equipment, From elevated surface           General transfer comment: Unable to achieve full standing without placing weight through LLE. Pt unable to shift weight to the R to off-weight the L. Multiple attempts to clear hips but unable without placing more weight through LLE than TDWB.    Ambulation/Gait               General Gait Details: unable to progress to gait training this session.   Stairs             Wheelchair Mobility     Tilt Bed    Modified Rankin (Stroke Patients Only)       Balance Overall balance assessment: Needs assistance, History of Falls, Mild deficits observed, not formally tested Sitting-balance support: Feet supported, No upper extremity supported Sitting balance-Leahy Scale: Fair       Standing balance-Leahy Scale: Zero Standing balance comment: reliant on max A+2 from therapy.                            Communication Communication Communication: Impaired Factors Affecting Communication: Difficulty expressing self  Cognition Arousal: Alert Behavior During Therapy: Flat affect, Restless   PT - Cognitive impairments: History of cognitive impairments, Awareness, Memory, Attention, Sequencing, Problem solving, Safety/Judgement  PT - Cognition Comments: Externally distracted by pain and with IV line/catheter tubing. Frequent cues to adhere to task with visual  demonstrations of commands. Following commands: Impaired Following commands impaired: Follows one step commands with increased time, Follows multi-step commands inconsistently    Cueing Cueing Techniques: Gestural cues, Tactile cues, Verbal cues  Exercises      General Comments        Pertinent Vitals/Pain Pain Assessment Pain Assessment: Faces Faces Pain Scale: Hurts little more Pain Location: hip per pt Pain Descriptors / Indicators: Grimacing Pain Intervention(s): Limited activity within patient's tolerance, Monitored during session, Repositioned    Home Living                          Prior Function            PT Goals (current goals can now be found in the care plan section) Acute Rehab PT Goals Patient Stated Goal: pt did not state goal PT Goal Formulation: With patient/family Time For Goal Achievement: 06/20/24 Potential to Achieve Goals: Fair Progress towards PT goals: Progressing toward goals    Frequency    Min 1X/week      PT Plan      Co-evaluation              AM-PAC PT 6 Clicks Mobility   Outcome Measure  Help needed turning from your back to your side while in a flat bed without using bedrails?: Total Help needed moving from lying on your back to sitting on the side of a flat bed without using bedrails?: Total Help needed moving to and from a bed to a chair (including a wheelchair)?: Total Help needed standing up from a chair using your arms (e.g., wheelchair or bedside chair)?: Total Help needed to walk in hospital room?: Total Help needed climbing 3-5 steps with a railing? : Total 6 Click Score: 6    End of Session Equipment Utilized During Treatment: Gait belt Activity Tolerance: Patient tolerated treatment well Patient left: in bed;with call bell/phone within reach;with bed alarm set Nurse Communication: Mobility status PT Visit Diagnosis: Unsteadiness on feet (R26.81);Other abnormalities of gait and mobility  (R26.89);Muscle weakness (generalized) (M62.81);History of falling (Z91.81)     Time: 9062-8997 PT Time Calculation (min) (ACUTE ONLY): 25 min  Charges:    $Gait Training: 8-22 mins $Therapeutic Activity: 8-22 mins PT General Charges $$ ACUTE PT VISIT: 1 Visit                     Leita Sable, PT, DPT Acute Rehabilitation Services Secure Chat Preferred Office: [phone]    Leita JONETTA Sable 06/10/2024, 11:12 AM

## 2024-06-10 NOTE — TOC Transition Note (Signed)
 Transition of Care [facility]) - Discharge Note   Patient Details  Name: Kevin Valentine MRN: [id number] Date of Birth: Jul 15, 1941  Transition of [facility]) CM/SW Contact:  Dwaine Pringle E Karsen Nakanishi, LCSW Phone Number: 06/10/2024, 12:12 PM   Clinical Narrative:    Discharge to [facility] today. Room 121A. Confirmed with Admissions Worker Tanya. Updated MD, RN, and patient's son Ozell.  Asked RN to call report. EMS paperwork completed. PT[facility] arranged for 1pm pick up (may be later pending truck availability).    Final next level of care: Skilled Nursing Facility Barriers to Discharge: Barriers Resolved   Patient Goals and CMS Choice   CMS Medicare.gov Compare Post Acute Care list provided to:: Patient Represenative (must comment) Choice offered to / list presented to : Adult Children [place] ownership interest in [facility].provided to:: Adult Children    Discharge Placement              Patient chooses bed at: [facility] Patient to be transferred to facility by: PTAR Name of family member notified: Ozell - son Patient and family notified of of transfer: 06/10/24  Discharge Plan and Services Additional resources added to the After Visit Summary for   In-house Referral: Clinical Social Work   Post Acute Care Choice: Skilled Nursing Facility                               Social Drivers of Health (SDOH) Interventions SDOH Screenings   Food Insecurity: No Food Insecurity (03/11/2024)   Received from [facility]  Housing: Low Risk  (03/11/2024)   Received [facility]  Transportation Needs: No Transportation Needs (03/11/2024)   Received from [facility]  Utilities: Not At Risk (03/11/2024)   [facility] from [facility]  Financial Resource Strain: Patient Declined (03/11/2024)   Received from [facility]  Physical Activity: Unknown (12/10/2018)    Received from [facility]  Tobacco Use: Low Risk  (06/05/2024)  Recent Concern: Tobacco Use - Medium Risk (03/25/2024)   Received from [facility]     Readmission Risk Interventions     No data to display

## 2024-06-10 NOTE — TOC Progression Note (Addendum)
 Transition of Care [facility]) - Progression Note    Patient Details  Name: Kevin Valentine MRN: [id number] Date of Birth: May 04, 1941  [facility]) CM/SW Contact  Carmelita FORBES Carbon, LCSW Phone Number: 06/10/2024, 8:58 AM  Clinical Narrative:    PASRR [id number] A Per MD, patient is medically ready. Called son to present bed offers, left a VM requesting a return call.  10:10- Return call from son Ozell. Sent list of bed offers. Informed Ozell that patient is medically ready so we need a choice as soon as possible.  10:43- Son chose [place]. CSW left VM for Tanya at [facility] to see if they have a bed today.   Expected Discharge Plan: Skilled Nursing Facility Barriers to Discharge: Continued Medical Work up, SNF Pending bed offer  Expected Discharge Plan and Services In-house Referral: Clinical Social Work   Post Acute Care Choice: Skilled Nursing Facility Living arrangements for the past 2 months: Single Family Home                                       Social Determinants of Health (SDOH) Interventions SDOH Screenings   Food Insecurity: No Food Insecurity (03/11/2024)   Received from [facility]  Housing: Low Risk  (03/11/2024)   Received from [facility]  Transportation Needs: No Transportation Needs (03/11/2024)   Received from [facility]  Utilities: Not At Risk (03/11/2024)   Received from [facility]  Financial Resource Strain: Patient Declined (03/11/2024)   Received from [facility]  Physical Activity: Unknown (12/10/2018)   Received from [facility]  Tobacco Use: Low Risk  (06/05/2024)  Recent Concern: Tobacco Use - Medium Risk (03/25/2024)   Received from [facility]    Readmission Risk Interventions     No data to display

## 2024-06-10 NOTE — Progress Notes (Signed)
 Left VM at [facility] for report.  Left number for call back.. Will try again.

## 2024-06-10 NOTE — Care Management Important Message (Signed)
 Important Message  Patient Details  Name: Kevin Valentine MRN: [id number] Date of Birth: Apr 04, 1941   Important Message Given:  Yes - Medicare IM     Jon Cruel 06/10/2024, 12:36 PM
